# Patient Record
Sex: Female | Born: 1990 | Race: White | Hispanic: No | Marital: Single | Smoking: Former smoker
Health system: Southern US, Community
[De-identification: ages and names within clinical notes are randomized; demographics above are authoritative.]

## PROBLEM LIST (undated history)

## (undated) DIAGNOSIS — R195 Other fecal abnormalities: Secondary | ICD-10-CM

## (undated) DIAGNOSIS — L299 Pruritus, unspecified: Secondary | ICD-10-CM

## (undated) DIAGNOSIS — A072 Cryptosporidiosis: Secondary | ICD-10-CM

## (undated) DIAGNOSIS — M1990 Unspecified osteoarthritis, unspecified site: Secondary | ICD-10-CM

## (undated) DIAGNOSIS — F419 Anxiety disorder, unspecified: Secondary | ICD-10-CM

## (undated) DIAGNOSIS — R519 Headache, unspecified: Secondary | ICD-10-CM

## (undated) DIAGNOSIS — L409 Psoriasis, unspecified: Secondary | ICD-10-CM

## (undated) DIAGNOSIS — G4730 Sleep apnea, unspecified: Secondary | ICD-10-CM

## (undated) DIAGNOSIS — K219 Gastro-esophageal reflux disease without esophagitis: Secondary | ICD-10-CM

## (undated) DIAGNOSIS — F32A Depression, unspecified: Secondary | ICD-10-CM

## (undated) DIAGNOSIS — G43909 Migraine, unspecified, not intractable, without status migrainosus: Secondary | ICD-10-CM

## (undated) HISTORY — DX: Migraine, unspecified, not intractable, without status migrainosus: G43.909

## (undated) HISTORY — DX: Unspecified osteoarthritis, unspecified site: M19.90

## (undated) HISTORY — DX: Sleep apnea, unspecified: G47.30

## (undated) HISTORY — PX: URETERAL EXPLORATION: SHX2609

## (undated) HISTORY — DX: Cryptosporidiosis: A07.2

## (undated) HISTORY — PX: WISDOM TOOTH EXTRACTION: SHX21

## (undated) HISTORY — DX: Gastro-esophageal reflux disease without esophagitis: K21.9

## (undated) HISTORY — DX: Pruritus, unspecified: L29.9

## (undated) HISTORY — DX: Depression, unspecified: F32.A

## (undated) HISTORY — DX: Psoriasis, unspecified: L40.9

## (undated) HISTORY — DX: Other fecal abnormalities: R19.5

## (undated) HISTORY — PX: TONSILLECTOMY: SUR1361

## (undated) HISTORY — DX: Headache, unspecified: R51.9

## (undated) HISTORY — PX: BLADDER SURGERY: SHX569

---
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
## 2012-03-04 DIAGNOSIS — Z3009 Encounter for other general counseling and advice on contraception: Secondary | ICD-10-CM | POA: Insufficient documentation

## 2012-03-04 DIAGNOSIS — Z3042 Encounter for surveillance of injectable contraceptive: Secondary | ICD-10-CM | POA: Insufficient documentation

## 2012-03-04 DIAGNOSIS — Z309 Encounter for contraceptive management, unspecified: Secondary | ICD-10-CM | POA: Insufficient documentation

## 2012-08-20 DIAGNOSIS — B977 Papillomavirus as the cause of diseases classified elsewhere: Secondary | ICD-10-CM | POA: Insufficient documentation

## 2014-10-04 ENCOUNTER — Ambulatory Visit: Payer: Medicaid Other | Attending: Family Medicine | Admitting: Family Medicine

## 2014-10-04 ENCOUNTER — Encounter: Payer: Self-pay | Admitting: Family Medicine

## 2014-10-04 VITALS — BP 99/65 | HR 98 | Temp 98.4°F | Resp 18 | Ht 63.0 in | Wt 149.0 lb

## 2014-10-04 DIAGNOSIS — J069 Acute upper respiratory infection, unspecified: Secondary | ICD-10-CM | POA: Insufficient documentation

## 2014-10-04 DIAGNOSIS — Z23 Encounter for immunization: Secondary | ICD-10-CM

## 2014-10-04 DIAGNOSIS — Z30011 Encounter for initial prescription of contraceptive pills: Secondary | ICD-10-CM | POA: Insufficient documentation

## 2014-10-04 DIAGNOSIS — IMO0001 Reserved for inherently not codable concepts without codable children: Secondary | ICD-10-CM | POA: Insufficient documentation

## 2014-10-04 LAB — POCT URINE PREGNANCY: Preg Test, Ur: NEGATIVE

## 2014-10-04 MED ORDER — NORGESTIMATE-ETH ESTRADIOL 0.25-35 MG-MCG PO TABS: 1.0000 | ORAL_TABLET | Freq: Every day | ORAL | Status: DC

## 2014-10-04 MED ORDER — TRIAMCINOLONE ACETONIDE 55 MCG/ACT NA AERO: 2.0000 | INHALATION_SPRAY | Freq: Every day | NASAL | Status: DC

## 2014-10-04 NOTE — Progress Notes (Signed)
Establish care Complaining Cough, sinus congestion x1 week Stated want to change from Depo-Inj to [facility]

## 2014-10-04 NOTE — Patient Instructions (Signed)
Ms. Bethann Goo,  Thank you for coming in today. It was a pleasure meeting you. I look forward to being your primary doctor.  Start sprintec for birth control. Use with back up birth control, condoms for first week.  Recommend nasal saline or nasacort for cold symptoms.   Dr. Adrian Blackwater

## 2014-10-04 NOTE — Progress Notes (Signed)
   Subjective:    Patient ID: Ann Rice, female    DOB: 01-27-1991, 23 y.o.   MRN: [id number] CC: establish care, cough and congestion, birth control  HPI  1. Congestion: x one week. With cough that is yellow/green. No fever. No CP or SOB. With sinus pressure, frontal and temple headache.  Sick contact is mother. Taking Nyquil and Sudafed for symptoms. Symptoms are the same.   2. Birth control: last depo 05/2014. Past due for injection.  On depo for 3 years. Interested to switching to pills. Sexually active with her husband only. Not interested in IUD, nexplanon, nuva ring or patch at this time.   Soc hx: non smoker Fam hx: negative for DM2 Surg hx: s/p L foot surg  Review of Systems As per HPI     Objective:   Physical Exam BP 99/65  Pulse 98  Temp(Src) 98.4 F (36.9 C) (Oral)  Resp 18  Ht 5\' 3"  (1.6 m)  Wt 149 lb (67.586 kg)  BMI 26.40 kg/m2  SpO2 100% General appearance: alert, cooperative and no distress Eyes: conjunctivae/corneas clear. PERRL, EOM's intact.  Ears: normal TM's and external ear canals both ears Nose: no discharge, turbinates pink, swollen Throat: lips, mucosa, and tongue normal; teeth and gums normal Lungs: clear to auscultation bilaterally Heart: regular rate and rhythm, S1, S2 normal, no murmur, click, rub or gallop     Assessment & Plan:

## 2014-10-04 NOTE — Assessment & Plan Note (Signed)
You have a viral URI with cough. For this please do the following:  1. Drink plenty of fluids. Hot tea, soup etc will help open your nasal passages. 2. Nasal saline or nasacort to open nasal passages

## 2014-10-04 NOTE — Assessment & Plan Note (Addendum)
A: switching from depo to OCP U preg negative  P:  sprintec prescribed

## 2015-05-14 ENCOUNTER — Encounter (HOSPITAL_COMMUNITY): Payer: Self-pay | Admitting: *Deleted

## 2015-05-14 ENCOUNTER — Emergency Department (HOSPITAL_COMMUNITY)
Admission: EM | Admit: 2015-05-14 | Discharge: 2015-05-14 | Disposition: A | Discharge status: Home / Self Care | Payer: Medicaid Other | Attending: Emergency Medicine | Admitting: Emergency Medicine

## 2015-05-14 DIAGNOSIS — K0889 Other specified disorders of teeth and supporting structures: Secondary | ICD-10-CM

## 2015-05-14 DIAGNOSIS — K088 Other specified disorders of teeth and supporting structures: Secondary | ICD-10-CM | POA: Insufficient documentation

## 2015-05-14 MED ORDER — IBUPROFEN 800 MG PO TABS: 800.0000 mg | ORAL_TABLET | Freq: Three times a day (TID) | ORAL | Status: DC

## 2015-05-14 NOTE — ED Provider Notes (Signed)
CSN: [id number]     Arrival date & time 05/14/15  1833 History  This chart was scribed for Quincy Carnes, PA-C working with Orlie Dakin, MD by Randa Evens, ED Scribe. This patient was seen in room WTR6/WTR6 and the patient's care was started at 6:51 PM.     Chief Complaint  Patient presents with  . Dental Pain   The history is provided by the patient. No language interpreter was used.   HPI Comments: Ann Rice is a 24 y.o. female who presents to the Emergency Department complaining of lower left dental pain that's worse on the left onset 4 days. Pt states that the pain radiating towards her front teeth. Pt states that she notices wisdom teeth are coming in. Pt states she has noticed some slight bleeding as well on the right side. Pt states that the pain is worse when eating. Pt doesn't report any medications PTA. Pt doesn't report fever, chills or trouble swallowing.    History reviewed. No pertinent past medical history. Past Surgical History  Procedure Laterality Date  . Tonsillectomy      At age of 36  . Foot surgery Left 2008     great toe broken, pin placed and removed    Family History  Problem Relation Age of Onset  . Anxiety disorder Mother   . Cancer Maternal Grandfather     lung   . Cancer Paternal Grandmother 24    lung cancer   . Diabetes Neg Hx   . Heart disease Neg Hx    History  Substance Use Topics  . Smoking status: Never Smoker   . Smokeless tobacco: Never Used  . Alcohol Use: No   OB History    No data available      Review of Systems  Constitutional: Negative for fever and chills.  HENT: Positive for dental problem. Negative for trouble swallowing.   All other systems reviewed and are negative.    Allergies  Review of patient's allergies indicates no known allergies.  Home Medications   Prior to Admission medications   Medication Sig Start Date End Date Taking? Authorizing Provider  naproxen sodium (ANAPROX) 220 MG tablet Take 440  mg by mouth every 12 (twelve) hours as needed (pain).   Yes Historical Provider, MD  norgestimate-ethinyl estradiol ([place] 28) 0.25-35 MG-MCG tablet Take 1 tablet by mouth daily. Patient not taking: Reported on 05/14/2015 10/04/14   Boykin Nearing, MD  triamcinolone (NASACORT AQ) 55 MCG/ACT AERO nasal inhaler Place 2 sprays into the nose daily. Patient not taking: Reported on 05/14/2015 10/04/14   Josalyn Funches, MD   BP 131/78 mmHg  Pulse 96  Temp(Src) 98.1 F (36.7 C) (Oral)  Resp 17  SpO2 100%  LMP 04/30/2015   Physical Exam  Constitutional: She is oriented to person, place, and time. She appears well-developed and well-nourished. No distress.  HENT:  Head: Normocephalic and atraumatic.  Mouth/Throat: Uvula is midline, oropharynx is clear and moist and mucous membranes are normal. Normal dentition. No dental abscesses or dental caries. No oropharyngeal exudate, posterior oropharyngeal edema, posterior oropharyngeal erythema or tonsillar abscesses.  Teeth largely in good dentition, left lower wisdom tooth palpable beneath gum but has not erupted through, surrounding gingiva normal in appearance, handling secretions appropriately, no trismus  Eyes: Conjunctivae and EOM are normal. Pupils are equal, round, and reactive to light.  Neck: Normal range of motion. Neck supple.  Cardiovascular: Normal rate, regular rhythm and normal heart sounds.   Pulmonary/Chest: Effort normal  and breath sounds normal. No respiratory distress. She has no wheezes.  Musculoskeletal: Normal range of motion.  Neurological: She is alert and oriented to person, place, and time.  Skin: Skin is warm and dry. She is not diaphoretic.  Psychiatric: She has a normal mood and affect.  Nursing note and vitals reviewed.   ED Course  Procedures (including critical care time) DIAGNOSTIC STUDIES: Oxygen Saturation is 100% on RA, normal by my interpretation.    COORDINATION OF CARE: 7:04 PM-Discussed treatment plan  with pt at bedside and pt agreed to plan.    Labs Review Labs Reviewed - No data to display  Imaging Review No results found.   EKG Interpretation None      MDM   Final diagnoses:  Pain, dental   Dental pain secondary to wisdom teeth, has not erupted through the gums yet. No signs of infection at this time.  Will refer to dentist as well as oral surgeon.  Motrin for pain.  Discussed plan with patient, he/she acknowledged understanding and agreed with plan of care.  Return precautions given for new or worsening symptoms.  I personally performed the services described in this documentation, which was scribed in my presence. The recorded information has been reviewed and is accurate.  Larene Pickett, PA-C 05/14/15 [place], MD 05/14/15 [phone]

## 2015-05-14 NOTE — ED Notes (Signed)
Pt reports wisdom teeth pain x 3 days.  Reports noticing blood coming from the right side.

## 2015-05-14 NOTE — Discharge Instructions (Signed)
Take the prescribed medication as directed. Follow-up with Dr. Geralynn Ochs-- call to make appt. Return to the ED for new or worsening symptoms.

## 2016-11-25 ENCOUNTER — Ambulatory Visit: Payer: Self-pay

## 2018-12-31 DIAGNOSIS — Z3490 Encounter for supervision of normal pregnancy, unspecified, unspecified trimester: Secondary | ICD-10-CM | POA: Insufficient documentation

## 2019-01-27 LAB — OB RESULTS CONSOLE GC/CHLAMYDIA
Chlamydia: NEGATIVE
Gonorrhea: NEGATIVE

## 2019-01-27 LAB — OB RESULTS CONSOLE RPR: RPR: NONREACTIVE

## 2019-01-27 LAB — OB RESULTS CONSOLE ABO/RH: RH Type: POSITIVE

## 2019-01-27 LAB — OB RESULTS CONSOLE HIV ANTIBODY (ROUTINE TESTING): HIV: NONREACTIVE

## 2019-01-27 LAB — OB RESULTS CONSOLE RUBELLA ANTIBODY, IGM: Rubella: IMMUNE

## 2019-01-27 LAB — OB RESULTS CONSOLE HEPATITIS B SURFACE ANTIGEN: Hepatitis B Surface Ag: NEGATIVE

## 2019-01-27 LAB — OB RESULTS CONSOLE ANTIBODY SCREEN: Antibody Screen: NEGATIVE

## 2019-07-29 LAB — OB RESULTS CONSOLE GBS: GBS: NEGATIVE

## 2019-08-05 ENCOUNTER — Other Ambulatory Visit: Payer: Self-pay | Admitting: Obstetrics and Gynecology

## 2019-08-06 ENCOUNTER — Encounter (HOSPITAL_COMMUNITY): Payer: Self-pay | Admitting: *Deleted

## 2019-08-06 ENCOUNTER — Telehealth (HOSPITAL_COMMUNITY): Payer: Self-pay | Admitting: *Deleted

## 2019-08-06 NOTE — Telephone Encounter (Signed)
Preadmission screen  

## 2019-08-16 ENCOUNTER — Other Ambulatory Visit (HOSPITAL_COMMUNITY)
Admission: RE | Admit: 2019-08-16 | Discharge: 2019-08-16 | Disposition: A | Discharge status: Home / Self Care | Payer: BC Managed Care – PPO | Source: Ambulatory Visit | Attending: Obstetrics and Gynecology | Admitting: Obstetrics and Gynecology

## 2019-08-16 ENCOUNTER — Other Ambulatory Visit: Payer: Self-pay

## 2019-08-16 DIAGNOSIS — Z01812 Encounter for preprocedural laboratory examination: Secondary | ICD-10-CM | POA: Diagnosis present

## 2019-08-16 DIAGNOSIS — Z20828 Contact with and (suspected) exposure to other viral communicable diseases: Secondary | ICD-10-CM | POA: Insufficient documentation

## 2019-08-16 LAB — SARS CORONAVIRUS 2 (TAT 6-24 HRS): SARS Coronavirus 2: NEGATIVE

## 2019-08-16 NOTE — MAU Note (Signed)
Asymptomatic, swab collected. 

## 2019-08-18 ENCOUNTER — Other Ambulatory Visit: Payer: Self-pay

## 2019-08-18 ENCOUNTER — Inpatient Hospital Stay (HOSPITAL_COMMUNITY)
Admission: AD | Admit: 2019-08-18 | Discharge: 2019-08-22 | DRG: 787 | Disposition: A | Discharge status: Home / Self Care | Payer: BC Managed Care – PPO | Attending: Obstetrics and Gynecology | Admitting: Obstetrics and Gynecology

## 2019-08-18 ENCOUNTER — Inpatient Hospital Stay (HOSPITAL_COMMUNITY): Payer: BC Managed Care – PPO | Admitting: Anesthesiology

## 2019-08-18 ENCOUNTER — Encounter (HOSPITAL_COMMUNITY): Payer: Self-pay | Admitting: *Deleted

## 2019-08-18 ENCOUNTER — Inpatient Hospital Stay (HOSPITAL_COMMUNITY): Payer: BC Managed Care – PPO

## 2019-08-18 DIAGNOSIS — O3663X0 Maternal care for excessive fetal growth, third trimester, not applicable or unspecified: Principal | ICD-10-CM | POA: Diagnosis present

## 2019-08-18 DIAGNOSIS — O26893 Other specified pregnancy related conditions, third trimester: Secondary | ICD-10-CM | POA: Diagnosis present

## 2019-08-18 DIAGNOSIS — F3181 Bipolar II disorder: Secondary | ICD-10-CM | POA: Diagnosis present

## 2019-08-18 DIAGNOSIS — O9902 Anemia complicating childbirth: Secondary | ICD-10-CM | POA: Diagnosis present

## 2019-08-18 DIAGNOSIS — D649 Anemia, unspecified: Secondary | ICD-10-CM | POA: Diagnosis present

## 2019-08-18 DIAGNOSIS — Z3A39 39 weeks gestation of pregnancy: Secondary | ICD-10-CM

## 2019-08-18 DIAGNOSIS — O99344 Other mental disorders complicating childbirth: Secondary | ICD-10-CM | POA: Diagnosis present

## 2019-08-18 DIAGNOSIS — E669 Obesity, unspecified: Secondary | ICD-10-CM | POA: Diagnosis present

## 2019-08-18 DIAGNOSIS — F419 Anxiety disorder, unspecified: Secondary | ICD-10-CM | POA: Diagnosis present

## 2019-08-18 DIAGNOSIS — O99214 Obesity complicating childbirth: Secondary | ICD-10-CM | POA: Diagnosis present

## 2019-08-18 HISTORY — DX: Anxiety disorder, unspecified: F41.9

## 2019-08-18 LAB — CBC
HCT: 32.4 % — ABNORMAL LOW (ref 36.0–46.0)
Hemoglobin: 10.1 g/dL — ABNORMAL LOW (ref 12.0–15.0)
MCH: 24.9 pg — ABNORMAL LOW (ref 26.0–34.0)
MCHC: 31.2 g/dL (ref 30.0–36.0)
MCV: 80 fL (ref 80.0–100.0)
Platelets: 333 10*3/uL (ref 150–400)
RBC: 4.05 MIL/uL (ref 3.87–5.11)
RDW: 15 % (ref 11.5–15.5)
WBC: 12.6 10*3/uL — ABNORMAL HIGH (ref 4.0–10.5)
nRBC: 0 % (ref 0.0–0.2)

## 2019-08-18 LAB — TYPE AND SCREEN
ABO/RH(D): O POS
Antibody Screen: NEGATIVE

## 2019-08-18 LAB — ABO/RH: ABO/RH(D): O POS

## 2019-08-18 LAB — RPR: RPR Ser Ql: NONREACTIVE

## 2019-08-18 MED ORDER — OXYTOCIN BOLUS FROM INFUSION: 500.0000 mL | Freq: Once | INTRAVENOUS | Status: DC

## 2019-08-18 MED ORDER — FENTANYL-BUPIVACAINE-NACL 0.5-0.125-0.9 MG/250ML-% EP SOLN
12.0000 mL/h | EPIDURAL | Status: DC | PRN
  Filled 2019-08-18: qty 250

## 2019-08-18 MED ORDER — LIDOCAINE HCL (PF) 1 % IJ SOLN
INTRAMUSCULAR | Status: DC | PRN
  Administered 2019-08-18: 4 mL via EPIDURAL
  Administered 2019-08-18: 5 mL via EPIDURAL

## 2019-08-18 MED ORDER — MISOPROSTOL 25 MCG QUARTER TABLET
25.0000 ug | ORAL_TABLET | ORAL | Status: DC | PRN
  Administered 2019-08-18 (×3): 25 ug via VAGINAL
  Filled 2019-08-18 (×3): qty 1

## 2019-08-18 MED ORDER — ONDANSETRON HCL 4 MG/2ML IJ SOLN: 4.0000 mg | Freq: Four times a day (QID) | INTRAMUSCULAR | Status: DC | PRN

## 2019-08-18 MED ORDER — TERBUTALINE SULFATE 1 MG/ML IJ SOLN: 0.2500 mg | Freq: Once | INTRAMUSCULAR | Status: DC | PRN

## 2019-08-18 MED ORDER — ACETAMINOPHEN 325 MG PO TABS: 650.0000 mg | ORAL_TABLET | ORAL | Status: DC | PRN

## 2019-08-18 MED ORDER — SOD CITRATE-CITRIC ACID 500-334 MG/5ML PO SOLN
30.0000 mL | ORAL | Status: DC | PRN
  Administered 2019-08-19: 30 mL via ORAL
  Filled 2019-08-18: qty 30

## 2019-08-18 MED ORDER — LACTATED RINGERS IV SOLN
INTRAVENOUS | Status: DC
  Administered 2019-08-18 – 2019-08-19 (×5): via INTRAVENOUS

## 2019-08-18 MED ORDER — EPHEDRINE 5 MG/ML INJ: 10.0000 mg | INTRAVENOUS | Status: DC | PRN

## 2019-08-18 MED ORDER — PHENYLEPHRINE 40 MCG/ML (10ML) SYRINGE FOR IV PUSH (FOR BLOOD PRESSURE SUPPORT): 80.0000 ug | PREFILLED_SYRINGE | INTRAVENOUS | Status: DC | PRN

## 2019-08-18 MED ORDER — LIDOCAINE HCL (PF) 1 % IJ SOLN: 30.0000 mL | INTRAMUSCULAR | Status: DC | PRN

## 2019-08-18 MED ORDER — LACTATED RINGERS IV SOLN: 500.0000 mL | Freq: Once | INTRAVENOUS | Status: DC

## 2019-08-18 MED ORDER — OXYTOCIN 40 UNITS IN NORMAL SALINE INFUSION - SIMPLE MED: 2.5000 [IU]/h | INTRAVENOUS | Status: DC

## 2019-08-18 MED ORDER — FENTANYL-BUPIVACAINE-NACL 0.5-0.125-0.9 MG/250ML-% EP SOLN
EPIDURAL | Status: AC
  Filled 2019-08-18: qty 250

## 2019-08-18 MED ORDER — LACTATED RINGERS IV SOLN: 500.0000 mL | INTRAVENOUS | Status: DC | PRN

## 2019-08-18 MED ORDER — DIPHENHYDRAMINE HCL 50 MG/ML IJ SOLN
12.5000 mg | INTRAMUSCULAR | Status: AC | PRN
  Administered 2019-08-18 – 2019-08-19 (×3): 12.5 mg via INTRAVENOUS
  Filled 2019-08-18 (×2): qty 1

## 2019-08-18 MED ORDER — OXYTOCIN 40 UNITS IN NORMAL SALINE INFUSION - SIMPLE MED
1.0000 m[IU]/min | INTRAVENOUS | Status: DC
  Administered 2019-08-18: 2 m[IU]/min via INTRAVENOUS
  Filled 2019-08-18: qty 1000

## 2019-08-18 MED ORDER — SODIUM CHLORIDE (PF) 0.9 % IJ SOLN
INTRAMUSCULAR | Status: DC | PRN
  Administered 2019-08-18: 12 mL/h via EPIDURAL

## 2019-08-18 NOTE — Anesthesia Procedure Notes (Signed)
Epidural Patient location during procedure: OB Start time: 08/18/2019 5:20 PM End time: 08/18/2019 5:24 PM  Staffing Anesthesiologist: Audry Pili, MD Performed: anesthesiologist   Preanesthetic Checklist Completed: patient identified, pre-op evaluation, timeout performed, IV checked, risks and benefits discussed and monitors and equipment checked  Epidural Patient position: sitting Prep: DuraPrep Patient monitoring: continuous pulse ox and blood pressure Approach: midline Location: L2-L3 Injection technique: LOR saline  Needle:  Needle type: Tuohy  Needle gauge: 17 G Needle length: 9 cm Needle insertion depth: 5.5 cm Catheter size: 19 Gauge Catheter at skin depth: 11 cm Test dose: negative and Other (1% lidocaine)  Assessment Events: blood not aspirated  Additional Notes Patient identified. Risks including, but not limited to, bleeding, infection, nerve damage, paralysis, inadequate analgesia, blood pressure changes, nausea, vomiting, allergic reaction, postpartum back pain, itching, and headache were discussed. Patient expressed understanding and wished to proceed. Sterile prep and drape, including hand hygiene, mask, and sterile gloves were used. The patient was positioned and the spine was prepped. The skin was anesthetized with lidocaine. No paraesthesia or other complication noted. The patient did not experience any signs of intravascular injection such as tinnitus or metallic taste in mouth, nor signs of intrathecal spread such as rapid motor block. Please see nursing notes for vital signs. The patient tolerated the procedure well.   Renold Don, MDReason for block:procedure for pain

## 2019-08-18 NOTE — Anesthesia Preprocedure Evaluation (Addendum)
Anesthesia Evaluation  Patient identified by MRN, date of birth, ID band Patient awake    Reviewed: Allergy & Precautions, NPO status , Patient's Chart, lab work & pertinent test results  History of Anesthesia Complications Negative for: history of anesthetic complications  Airway Mallampati: II   Neck ROM: Full    Dental   Pulmonary Current Smoker and Patient abstained from smoking.,    Pulmonary exam normal        Cardiovascular negative cardio ROS Normal cardiovascular exam     Neuro/Psych PSYCHIATRIC DISORDERS Anxiety negative neurological ROS     GI/Hepatic negative GI ROS, Neg liver ROS,   Endo/Other   Obesity   Renal/GU negative Renal ROS     Musculoskeletal negative musculoskeletal ROS (+)   Abdominal (+) + obese,   Peds  Hematology  (+) anemia ,   Anesthesia Other Findings   Reproductive/Obstetrics (+) Pregnancy                            Anesthesia Physical Anesthesia Plan  ASA: II  Anesthesia Plan: Epidural   Post-op Pain Management:    Induction:   PONV Risk Score and Plan: 2 and Treatment may vary due to age or medical condition  Airway Management Planned: Natural Airway  Additional Equipment: None  Intra-op Plan:   Post-operative Plan:   Informed Consent: I have reviewed the patients History and Physical, chart, labs and discussed the procedure including the risks, benefits and alternatives for the proposed anesthesia with the patient or authorized representative who has indicated his/her understanding and acceptance.       Plan Discussed with: Anesthesiologist  Anesthesia Plan Comments: (Labs reviewed. Platelets acceptable, patient not taking any blood thinning medications. Per RN, FHR tracing reported to be stable enough for sitting procedure. Risks and benefits discussed with patient, including PDPH, backache, epidural hematoma, failed epidural, blood  pressure changes, allergic reaction, and nerve injury. Patient expressed understanding and wished to proceed.)        Anesthesia Quick Evaluation

## 2019-08-18 NOTE — H&P (Signed)
28 y.o. [redacted]w[redacted]d  G1P0 comes in for induction at term.  Otherwise has good fetal movement and no bleeding.  Past Medical History:  Diagnosis Date  . Anxiety   . Psoriasis     Past Surgical History:  Procedure Laterality Date  . BLADDER SURGERY    . FOOT SURGERY Left 2008    great toe broken, pin placed and removed   . TONSILLECTOMY     At age of 36  . WISDOM TOOTH EXTRACTION      OB History  Gravida Para Term Preterm AB Living  1            SAB TAB Ectopic Multiple Live Births               # Outcome Date GA Lbr Len/2nd Weight Sex Delivery Anes PTL Lv  1 Current             Social History   Socioeconomic History  . Marital status: Married    Spouse name: Not on file  . Number of children: 0   . Years of education: some colle  . Highest education level: Not on file  Occupational History  . Occupation: Unemployed     Employer: [employer]  Social Needs  . Financial resource strain: Not hard at all  . Food insecurity    Worry: Never true    Inability: Never true  . Transportation needs    Medical: No    Non-medical: No  Tobacco Use  . Smoking status: Never Smoker  . Smokeless tobacco: Never Used  Substance and Sexual Activity  . Alcohol use: No  . Drug use: No  . Sexual activity: Yes    Birth control/protection: Injection  Lifestyle  . Physical activity    Days per week: Not on file    Minutes per session: Not on file  . Stress: Not on file  Relationships  . Social Herbalist on phone: Not on file    Gets together: Not on file    Attends religious service: Not on file    Active member of club or organization: Not on file    Attends meetings of clubs or organizations: Not on file    Relationship status: Not on file  . Intimate partner violence    Fear of current or ex partner: Not on file    Emotionally abused: Not on file    Physically abused: Not on file    Forced sexual activity: Not on file  Other Topics Concern  . Not on file   Social History Narrative   Lives with husband.   Has 2 dogs.          Patient has no known allergies.    Prenatal Transfer Tool  Maternal Diabetes: No Genetic Screening: Normal Maternal Ultrasounds/Referrals: Other:poly for third trimester now resolved Fetal Ultrasounds or other Referrals:  None Maternal Substance Abuse:  No Significant Maternal Medications:  Meds include: Other: Latuda, celexa, flexeril, hydroxyzine Significant Maternal Lab Results: Group B Strep negative  Other PNC: Complicated by Type 2 bipolar d/o being treated with Latuda and Celexa.  Anxiety during pregnancy worsened.  Pt's fetus has always measured in 90%+ and last US showed EFW to be 4050 gm (9#).  Poly has resolved.    Vitals:   08/18/19 0500 08/18/19 0600 08/18/19 0700 08/18/19 0730  BP: 126/73 122/77 125/81   Pulse: 88 90 91   Resp: 14 16 14    Temp: 98.2  F (36.8 C)   98.2 F (36.8 C)  TempSrc: Oral   Oral  Weight:      Height:        Lungs/Cor:  NAD Abdomen:  soft, gravid Ex:  no cords, erythema SVE:  1/30/-2, VTX FHTs:  120s, good STV, NST R; Cat 1 tracing. Toco:  q 5-20   A/P   Term for induction.  LGA but well under cut off for C/S.  Waiting longer is unlikely to increase chances of vaginal delivery.  GBS neg.  Daria Pastures

## 2019-08-18 NOTE — Progress Notes (Signed)
Patient expressed desire to evaluate need of epidural at 1645. Pain at this time is equal to a 3.

## 2019-08-18 NOTE — Progress Notes (Signed)
Spoke to Dr. Philis Pique at 2322. Dr. Philis Pique stated to increase pitocin up to 20. Once at 20, cut pitocin in half and continue to titrate up as needed.

## 2019-08-19 LAB — COMPREHENSIVE METABOLIC PANEL
ALT: 13 U/L (ref 0–44)
AST: 21 U/L (ref 15–41)
Albumin: 2.5 g/dL — ABNORMAL LOW (ref 3.5–5.0)
Alkaline Phosphatase: 143 U/L — ABNORMAL HIGH (ref 38–126)
Anion gap: 9 (ref 5–15)
BUN: <5 mg/dL — ABNORMAL LOW (ref 6–20)
CO2: 23 mmol/L (ref 22–32)
Calcium: 8.7 mg/dL — ABNORMAL LOW (ref 8.9–10.3)
Chloride: 105 mmol/L (ref 98–111)
Creatinine, Ser: 0.74 mg/dL (ref 0.44–1.00)
GFR calc Af Amer: >60 mL/min (ref >60)
GFR calc non Af Amer: >60 mL/min (ref >60)
Glucose, Bld: 100 mg/dL — ABNORMAL HIGH (ref 70–99)
Potassium: 3.6 mmol/L (ref 3.5–5.1)
Sodium: 137 mmol/L (ref 135–145)
Total Bilirubin: 0.5 mg/dL (ref 0.3–1.2)
Total Protein: 5.8 g/dL — ABNORMAL LOW (ref 6.5–8.1)

## 2019-08-19 LAB — CBC
HCT: 33.2 % — ABNORMAL LOW (ref 36.0–46.0)
Hemoglobin: 10.5 g/dL — ABNORMAL LOW (ref 12.0–15.0)
MCH: 25.1 pg — ABNORMAL LOW (ref 26.0–34.0)
MCHC: 31.6 g/dL (ref 30.0–36.0)
MCV: 79.4 fL — ABNORMAL LOW (ref 80.0–100.0)
Platelets: 308 10*3/uL (ref 150–400)
RBC: 4.18 MIL/uL (ref 3.87–5.11)
RDW: 15.1 % (ref 11.5–15.5)
WBC: 17.3 10*3/uL — ABNORMAL HIGH (ref 4.0–10.5)
nRBC: 0 % (ref 0.0–0.2)

## 2019-08-19 MED ORDER — MORPHINE SULFATE (PF) 0.5 MG/ML IJ SOLN
INTRAMUSCULAR | Status: AC
  Filled 2019-08-19: qty 10

## 2019-08-19 NOTE — Progress Notes (Signed)
Called by Dr. Philis Pique for assistance placing foley bulb for patient.   Patient amenable, SVE 1-2/80/-1, FB placed with 60cc, well tolerated by patient.

## 2019-08-19 NOTE — Progress Notes (Signed)
Pt is getting more uncomfortable and has been pushing with great effort for over 3 hours with some intermittent breaks.  Vitals:   08/19/19 2201 08/19/19 2230 08/19/19 2302 08/19/19 2316  BP: 129/74 112/66 121/65   Pulse: 83 90 (!) 121   Resp: 18 18 18    Temp:    98.3 F (36.8 C)  TempSrc:    Oral  Weight:      Height:       FHTS 120s, gSTV, NST R Toco q 2-3 SVE still C/C/+1 from when checked by myself at 2100  A/P Pt with good pushing effort for over 3 hours with no descent.  Not in range for VE assistance.  D/w pt all R/B/Alt of C/S and pt agrees to proceed.

## 2019-08-19 NOTE — Progress Notes (Signed)
Pt comfortable with epidural.  Foley bulb was placed and then out in an hour as pt made it to 4 cm.  Vitals:   08/19/19 0602 08/19/19 0631 08/19/19 0700 08/19/19 0701  BP: 129/84 136/81 (!) 142/83 (!) 142/83  Pulse: 89 81 86 86  Resp: 18 18  18   Temp:    98 F (36.7 C)  TempSrc:    Oral  Weight:      Height:       FHTs 120s, gSTV, NST R Toco q3-4 SVE now 6-7/C/0  A/P Continue induction.  Last 2 BPs were elevated- first one was earlier this am at about 1 am.  Will send labs again today.  Will treat with magnesium sulfate if severe.

## 2019-08-19 NOTE — Progress Notes (Signed)
Foley bulb placed at 2357 by Dr. Dione Plover per Dr. Philis Pique request.

## 2019-08-20 ENCOUNTER — Encounter (HOSPITAL_COMMUNITY): Payer: Self-pay

## 2019-08-20 ENCOUNTER — Encounter (HOSPITAL_COMMUNITY)
Admission: AD | Disposition: A | Discharge status: Home / Self Care | Payer: Self-pay | Source: Home / Self Care | Attending: Obstetrics and Gynecology

## 2019-08-20 LAB — CBC
HCT: 27.5 % — ABNORMAL LOW (ref 36.0–46.0)
Hemoglobin: 8.7 g/dL — ABNORMAL LOW (ref 12.0–15.0)
MCH: 25.4 pg — ABNORMAL LOW (ref 26.0–34.0)
MCHC: 31.6 g/dL (ref 30.0–36.0)
MCV: 80.4 fL (ref 80.0–100.0)
Platelets: 320 10*3/uL (ref 150–400)
RBC: 3.42 MIL/uL — ABNORMAL LOW (ref 3.87–5.11)
RDW: 15.3 % (ref 11.5–15.5)
WBC: 26.7 10*3/uL — ABNORMAL HIGH (ref 4.0–10.5)
nRBC: 0 % (ref 0.0–0.2)

## 2019-08-20 SURGERY — Surgical Case
Anesthesia: Epidural | Wound class: Clean Contaminated

## 2019-08-20 MED ORDER — OXYTOCIN 40 UNITS IN NORMAL SALINE INFUSION - SIMPLE MED: 2.5000 [IU]/h | INTRAVENOUS | Status: AC

## 2019-08-20 MED ORDER — PHENYLEPHRINE HCL (PRESSORS) 10 MG/ML IV SOLN
INTRAVENOUS | Status: DC | PRN
  Administered 2019-08-20 (×2): 40 ug via INTRAVENOUS
  Administered 2019-08-20: 80 ug via INTRAVENOUS

## 2019-08-20 MED ORDER — SIMETHICONE 80 MG PO CHEW: 80.0000 mg | CHEWABLE_TABLET | ORAL | Status: DC | PRN

## 2019-08-20 MED ORDER — CITALOPRAM HYDROBROMIDE 20 MG PO TABS
20.0000 mg | ORAL_TABLET | Freq: Every day | ORAL | Status: DC
  Administered 2019-08-20 – 2019-08-22 (×3): 20 mg via ORAL
  Filled 2019-08-20 (×3): qty 1

## 2019-08-20 MED ORDER — SIMETHICONE 80 MG PO CHEW
80.0000 mg | CHEWABLE_TABLET | ORAL | Status: DC
  Administered 2019-08-21 – 2019-08-22 (×2): 80 mg via ORAL
  Filled 2019-08-20 (×2): qty 1

## 2019-08-20 MED ORDER — KETOROLAC TROMETHAMINE 30 MG/ML IJ SOLN: 30.0000 mg | Freq: Once | INTRAMUSCULAR | Status: DC

## 2019-08-20 MED ORDER — METHYLERGONOVINE MALEATE 0.2 MG/ML IJ SOLN
INTRAMUSCULAR | Status: AC
  Filled 2019-08-20: qty 1

## 2019-08-20 MED ORDER — FENTANYL CITRATE (PF) 100 MCG/2ML IJ SOLN
INTRAMUSCULAR | Status: AC
  Filled 2019-08-20: qty 2

## 2019-08-20 MED ORDER — TRIAMCINOLONE ACETONIDE 55 MCG/ACT NA AERO: 2.0000 | INHALATION_SPRAY | Freq: Every day | NASAL | Status: DC

## 2019-08-20 MED ORDER — OXYCODONE HCL 5 MG PO TABS: 5.0000 mg | ORAL_TABLET | Freq: Once | ORAL | Status: DC | PRN

## 2019-08-20 MED ORDER — KETOROLAC TROMETHAMINE 30 MG/ML IJ SOLN: 30.0000 mg | Freq: Four times a day (QID) | INTRAMUSCULAR | Status: AC | PRN

## 2019-08-20 MED ORDER — MORPHINE SULFATE (PF) 0.5 MG/ML IJ SOLN
INTRAMUSCULAR | Status: DC | PRN
  Administered 2019-08-20: 3 mg via EPIDURAL

## 2019-08-20 MED ORDER — NALOXONE HCL 0.4 MG/ML IJ SOLN: 0.4000 mg | INTRAMUSCULAR | Status: DC | PRN

## 2019-08-20 MED ORDER — METHYLERGONOVINE MALEATE 0.2 MG/ML IJ SOLN
0.2000 mg | Freq: Once | INTRAMUSCULAR | Status: AC
  Administered 2019-08-20: 0.2 mg via INTRAMUSCULAR

## 2019-08-20 MED ORDER — CYCLOBENZAPRINE HCL 10 MG PO TABS
10.0000 mg | ORAL_TABLET | Freq: Three times a day (TID) | ORAL | Status: DC | PRN
  Filled 2019-08-20: qty 1

## 2019-08-20 MED ORDER — SODIUM CHLORIDE 0.9 % IV SOLN
INTRAVENOUS | Status: DC | PRN
  Administered 2019-08-20: 01:00:00 via INTRAVENOUS

## 2019-08-20 MED ORDER — FERROUS SULFATE 325 (65 FE) MG PO TABS
325.0000 mg | ORAL_TABLET | Freq: Two times a day (BID) | ORAL | Status: DC
  Administered 2019-08-20 – 2019-08-22 (×5): 325 mg via ORAL
  Filled 2019-08-20 (×5): qty 1

## 2019-08-20 MED ORDER — TETANUS-DIPHTH-ACELL PERTUSSIS 5-2.5-18.5 LF-MCG/0.5 IM SUSP: 0.5000 mL | Freq: Once | INTRAMUSCULAR | Status: DC

## 2019-08-20 MED ORDER — SENNOSIDES-DOCUSATE SODIUM 8.6-50 MG PO TABS
2.0000 | ORAL_TABLET | ORAL | Status: DC
  Administered 2019-08-21 – 2019-08-22 (×2): 2 via ORAL
  Filled 2019-08-20 (×2): qty 2

## 2019-08-20 MED ORDER — LACTATED RINGERS IV SOLN
INTRAVENOUS | Status: DC | PRN
  Administered 2019-08-20 (×3): via INTRAVENOUS

## 2019-08-20 MED ORDER — KETOROLAC TROMETHAMINE 30 MG/ML IJ SOLN
30.0000 mg | Freq: Four times a day (QID) | INTRAMUSCULAR | Status: AC | PRN
  Administered 2019-08-20: 30 mg via INTRAMUSCULAR

## 2019-08-20 MED ORDER — DIPHENHYDRAMINE HCL 25 MG PO CAPS: 25.0000 mg | ORAL_CAPSULE | ORAL | Status: DC | PRN

## 2019-08-20 MED ORDER — ONDANSETRON HCL 4 MG/2ML IJ SOLN: 4.0000 mg | Freq: Three times a day (TID) | INTRAMUSCULAR | Status: DC | PRN

## 2019-08-20 MED ORDER — DIPHENHYDRAMINE HCL 25 MG PO CAPS
25.0000 mg | ORAL_CAPSULE | Freq: Four times a day (QID) | ORAL | Status: DC | PRN
  Administered 2019-08-20 (×2): 25 mg via ORAL
  Filled 2019-08-20 (×2): qty 1

## 2019-08-20 MED ORDER — FENTANYL CITRATE (PF) 100 MCG/2ML IJ SOLN: 25.0000 ug | INTRAMUSCULAR | Status: DC | PRN

## 2019-08-20 MED ORDER — METHYLERGONOVINE MALEATE 0.2 MG PO TABS: 0.2000 mg | ORAL_TABLET | ORAL | Status: DC | PRN

## 2019-08-20 MED ORDER — NALBUPHINE HCL 10 MG/ML IJ SOLN: 5.0000 mg | INTRAMUSCULAR | Status: DC | PRN

## 2019-08-20 MED ORDER — DEXAMETHASONE SODIUM PHOSPHATE 10 MG/ML IJ SOLN
INTRAMUSCULAR | Status: AC
  Filled 2019-08-20: qty 1

## 2019-08-20 MED ORDER — METHYLERGONOVINE MALEATE 0.2 MG/ML IJ SOLN: 0.2000 mg | INTRAMUSCULAR | Status: DC | PRN

## 2019-08-20 MED ORDER — PRENATAL MULTIVITAMIN CH
1.0000 | ORAL_TABLET | Freq: Every day | ORAL | Status: DC
  Administered 2019-08-20 – 2019-08-22 (×3): 1 via ORAL
  Filled 2019-08-20 (×3): qty 1

## 2019-08-20 MED ORDER — LORATADINE 10 MG PO TABS
10.0000 mg | ORAL_TABLET | Freq: Every day | ORAL | Status: DC
  Administered 2019-08-20 – 2019-08-22 (×3): 10 mg via ORAL
  Filled 2019-08-20 (×3): qty 1

## 2019-08-20 MED ORDER — BISACODYL 10 MG RE SUPP: 10.0000 mg | Freq: Every day | RECTAL | Status: DC | PRN

## 2019-08-20 MED ORDER — MEASLES, MUMPS & RUBELLA VAC IJ SOLR: 0.5000 mL | Freq: Once | INTRAMUSCULAR | Status: DC

## 2019-08-20 MED ORDER — LACTATED RINGERS IV SOLN
INTRAVENOUS | Status: DC
  Administered 2019-08-20 (×2): via INTRAVENOUS

## 2019-08-20 MED ORDER — NALOXONE HCL 4 MG/10ML IJ SOLN
1.0000 ug/kg/h | INTRAVENOUS | Status: DC | PRN
  Filled 2019-08-20: qty 5

## 2019-08-20 MED ORDER — NALBUPHINE HCL 10 MG/ML IJ SOLN: 5.0000 mg | Freq: Once | INTRAMUSCULAR | Status: DC | PRN

## 2019-08-20 MED ORDER — METOCLOPRAMIDE HCL 5 MG/ML IJ SOLN
INTRAMUSCULAR | Status: DC | PRN
  Administered 2019-08-20: 10 mg via INTRAVENOUS

## 2019-08-20 MED ORDER — COCONUT OIL OIL: 1.0000 "application " | TOPICAL_OIL | Status: DC | PRN

## 2019-08-20 MED ORDER — ZOLPIDEM TARTRATE 5 MG PO TABS: 5.0000 mg | ORAL_TABLET | Freq: Every evening | ORAL | Status: DC | PRN

## 2019-08-20 MED ORDER — DIBUCAINE (PERIANAL) 1 % EX OINT: 1.0000 "application " | TOPICAL_OINTMENT | CUTANEOUS | Status: DC | PRN

## 2019-08-20 MED ORDER — OXYCODONE HCL 5 MG/5ML PO SOLN: 5.0000 mg | Freq: Once | ORAL | Status: DC | PRN

## 2019-08-20 MED ORDER — PHENYLEPHRINE 40 MCG/ML (10ML) SYRINGE FOR IV PUSH (FOR BLOOD PRESSURE SUPPORT)
PREFILLED_SYRINGE | INTRAVENOUS | Status: AC
  Filled 2019-08-20: qty 10

## 2019-08-20 MED ORDER — IBUPROFEN 800 MG PO TABS
800.0000 mg | ORAL_TABLET | Freq: Three times a day (TID) | ORAL | Status: DC
  Administered 2019-08-20 – 2019-08-21 (×5): 800 mg via ORAL
  Filled 2019-08-20 (×6): qty 1

## 2019-08-20 MED ORDER — MEPERIDINE HCL 25 MG/ML IJ SOLN: 6.2500 mg | INTRAMUSCULAR | Status: DC | PRN

## 2019-08-20 MED ORDER — METOCLOPRAMIDE HCL 5 MG/ML IJ SOLN
INTRAMUSCULAR | Status: AC
  Filled 2019-08-20: qty 2

## 2019-08-20 MED ORDER — CEFAZOLIN SODIUM-DEXTROSE 2-3 GM-%(50ML) IV SOLR
INTRAVENOUS | Status: DC | PRN
  Administered 2019-08-20: 2 g via INTRAVENOUS

## 2019-08-20 MED ORDER — LURASIDONE HCL 20 MG PO TABS
20.0000 mg | ORAL_TABLET | Freq: Every day | ORAL | Status: DC
  Administered 2019-08-20 – 2019-08-22 (×3): 20 mg via ORAL
  Filled 2019-08-20 (×3): qty 1

## 2019-08-20 MED ORDER — WITCH HAZEL-GLYCERIN EX PADS: 1.0000 "application " | MEDICATED_PAD | CUTANEOUS | Status: DC | PRN

## 2019-08-20 MED ORDER — FLEET ENEMA 7-19 GM/118ML RE ENEM: 1.0000 | ENEMA | Freq: Every day | RECTAL | Status: DC | PRN

## 2019-08-20 MED ORDER — ONDANSETRON HCL 4 MG/2ML IJ SOLN: 4.0000 mg | Freq: Four times a day (QID) | INTRAMUSCULAR | Status: DC | PRN

## 2019-08-20 MED ORDER — SIMETHICONE 80 MG PO CHEW
80.0000 mg | CHEWABLE_TABLET | Freq: Three times a day (TID) | ORAL | Status: DC
  Administered 2019-08-20 – 2019-08-22 (×7): 80 mg via ORAL
  Filled 2019-08-20 (×7): qty 1

## 2019-08-20 MED ORDER — HYDROXYZINE HCL 10 MG PO TABS
10.0000 mg | ORAL_TABLET | Freq: Three times a day (TID) | ORAL | Status: DC | PRN
  Filled 2019-08-20: qty 1

## 2019-08-20 MED ORDER — OXYCODONE-ACETAMINOPHEN 5-325 MG PO TABS
1.0000 | ORAL_TABLET | ORAL | Status: DC | PRN
  Administered 2019-08-21 (×2): 1 via ORAL
  Administered 2019-08-21 – 2019-08-22 (×3): 2 via ORAL
  Filled 2019-08-20 (×2): qty 2
  Filled 2019-08-20 (×2): qty 1
  Filled 2019-08-20: qty 2

## 2019-08-20 MED ORDER — SODIUM CHLORIDE 0.9 % IV SOLN
INTRAVENOUS | Status: DC | PRN
  Administered 2019-08-20: 40 [IU] via INTRAVENOUS

## 2019-08-20 MED ORDER — ONDANSETRON HCL 4 MG/2ML IJ SOLN
INTRAMUSCULAR | Status: DC | PRN
  Administered 2019-08-20: 4 mg via INTRAVENOUS

## 2019-08-20 MED ORDER — DIPHENHYDRAMINE HCL 50 MG/ML IJ SOLN: 12.5000 mg | INTRAMUSCULAR | Status: DC | PRN

## 2019-08-20 MED ORDER — MENTHOL 3 MG MT LOZG: 1.0000 | LOZENGE | OROMUCOSAL | Status: DC | PRN

## 2019-08-20 MED ORDER — FENTANYL CITRATE (PF) 100 MCG/2ML IJ SOLN
INTRAMUSCULAR | Status: DC | PRN
  Administered 2019-08-20: 100 ug via EPIDURAL

## 2019-08-20 MED ORDER — KETOROLAC TROMETHAMINE 30 MG/ML IJ SOLN
INTRAMUSCULAR | Status: AC
  Filled 2019-08-20: qty 1

## 2019-08-20 MED ORDER — ONDANSETRON HCL 4 MG/2ML IJ SOLN
INTRAMUSCULAR | Status: AC
  Filled 2019-08-20: qty 2

## 2019-08-20 MED ORDER — DEXAMETHASONE SODIUM PHOSPHATE 10 MG/ML IJ SOLN
INTRAMUSCULAR | Status: DC | PRN
  Administered 2019-08-20: 10 mg via INTRAVENOUS

## 2019-08-20 MED ORDER — SODIUM CHLORIDE 0.9% FLUSH: 3.0000 mL | INTRAVENOUS | Status: DC | PRN

## 2019-08-20 MED ORDER — SODIUM BICARBONATE 8.4 % IV SOLN
INTRAVENOUS | Status: DC | PRN
  Administered 2019-08-20: 10 mL via EPIDURAL
  Administered 2019-08-20: 2 mL via EPIDURAL

## 2019-08-20 MED ORDER — CEFAZOLIN SODIUM-DEXTROSE 2-4 GM/100ML-% IV SOLN
INTRAVENOUS | Status: AC
  Filled 2019-08-20: qty 100

## 2019-08-20 SURGICAL SUPPLY — 33 items
APL SKNCLS STERI-STRIP NONHPOA (GAUZE/BANDAGES/DRESSINGS) ×1
BENZOIN TINCTURE PRP APPL 2/3 (GAUZE/BANDAGES/DRESSINGS) ×2 IMPLANT
CHLORAPREP W/TINT 26ML (MISCELLANEOUS) ×2 IMPLANT
CLAMP CORD UMBIL (MISCELLANEOUS) IMPLANT
CLOTH BEACON ORANGE TIMEOUT ST (SAFETY) ×2 IMPLANT
DRSG OPSITE POSTOP 4X10 (GAUZE/BANDAGES/DRESSINGS) ×2 IMPLANT
ELECT REM PT RETURN 9FT ADLT (ELECTROSURGICAL) ×2
ELECTRODE REM PT RTRN 9FT ADLT (ELECTROSURGICAL) ×1 IMPLANT
EXTRACTOR VACUUM BELL STYLE (SUCTIONS) IMPLANT
GLOVE BIO SURGEON STRL SZ7 (GLOVE) ×2 IMPLANT
GLOVE BIOGEL PI IND STRL 7.0 (GLOVE) ×1 IMPLANT
GLOVE BIOGEL PI INDICATOR 7.0 (GLOVE) ×2
GOWN STRL REUS W/TWL LRG LVL3 (GOWN DISPOSABLE) ×4 IMPLANT
KIT ABG SYR 3ML LUER SLIP (SYRINGE) IMPLANT
NDL HYPO 25X5/8 SAFETYGLIDE (NEEDLE) IMPLANT
NEEDLE HYPO 25X5/8 SAFETYGLIDE (NEEDLE) IMPLANT
NS IRRIG 1000ML POUR BTL (IV SOLUTION) ×2 IMPLANT
PACK C SECTION WH (CUSTOM PROCEDURE TRAY) ×2 IMPLANT
PAD OB MATERNITY 4.3X12.25 (PERSONAL CARE ITEMS) ×2 IMPLANT
PENCIL SMOKE EVAC W/HOLSTER (ELECTROSURGICAL) ×2 IMPLANT
RTRCTR C-SECT PINK 25CM LRG (MISCELLANEOUS) ×2 IMPLANT
STRIP CLOSURE SKIN 1/2X4 (GAUZE/BANDAGES/DRESSINGS) ×2 IMPLANT
SUT MNCRL 0 VIOLET CTX 36 (SUTURE) ×2 IMPLANT
SUT MONOCRYL 0 CTX 36 (SUTURE) ×2
SUT PLAIN 2 0 XLH (SUTURE) ×1 IMPLANT
SUT VIC AB 0 CT1 27 (SUTURE) ×4
SUT VIC AB 0 CT1 27XBRD ANBCTR (SUTURE) ×2 IMPLANT
SUT VIC AB 2-0 CT1 27 (SUTURE) ×2
SUT VIC AB 2-0 CT1 TAPERPNT 27 (SUTURE) ×1 IMPLANT
SUT VIC AB 4-0 KS 27 (SUTURE) ×2 IMPLANT
TOWEL OR 17X24 6PK STRL BLUE (TOWEL DISPOSABLE) ×2 IMPLANT
TRAY FOLEY W/BAG SLVR 14FR LF (SET/KITS/TRAYS/PACK) ×1 IMPLANT
WATER STERILE IRR 1000ML POUR (IV SOLUTION) ×2 IMPLANT

## 2019-08-20 NOTE — Op Note (Signed)
08/20/2019  1:13 AM  PATIENT:  Ann Rice  28 y.o. female  PRE-OPERATIVE DIAGNOSIS:  Arrest of descent.  POST-OPERATIVE DIAGNOSIS: same  PROCEDURE:  Procedure(s): CESAREAN SECTION (N/A)  SURGEON:  Surgeon(s) and Role:    * Bobbye Charleston, MD - Primary  ANESTHESIA:   epidural  EBL:  677 cc  SPECIMEN:  No Specimen  DISPOSITION OF SPECIMEN:  N/A  COUNTS:  YES  TOURNIQUET:  * No tourniquets in log *  DICTATION: .Note written in EPIC  PLAN OF CARE: Admit to inpatient   PATIENT DISPOSITION:  PACU - hemodynamically stable.   Delay start of Pharmacological VTE agent (>24hrs) due to surgical blood loss or risk of bleeding: not applicable  Complications:  none Medications:  Ancef, Pitocin Findings:  Baby female, Apgars 8,9, weight P.   Normal tubes, ovaries and uterus seen.  Baby was skin to skin with mother after birth in the OR.  Technique:  After adequate epidural anesthesia was achieved, the patient was prepped and draped in usual sterile fashion.  A foley catheter was used to drain the bladder.  A pfannanstiel incision was made with the scalpel and carried down to the fascia with the bovie cautery. The fascia was incised in the midline with the scalpel and carried in a transverse curvilinear manner bilaterally.  The fascia was reflected superiorly and inferiorly off the rectus muscles and the muscles split in the midline.  A bowel free portion of the peritoneum was entered bluntly and then extended in a superior and inferior manner with good visualization of the bowel and bladder.  The Alexis instrument was then placed and the vesico-uterine fascia tented up and incised in a transverse curvilinear manner.  A 2 cm transverse incision was made in the upper portion of the lower uterine segment until the amnion was exposed.   The incision was extended transversely in a blunt manner.  Clear fluid was noted and the baby delivered in the vertex presentation without complication.   The baby was bulb suctioned and the cord was clamped and cut aftet stripping blood from cord into baby.  The baby was then handed to awaiting Neonatology.  The placenta was then delivered manually and the uterus cleared of all debris.  The uterine incision was then closed with a running lock stitch of 0 monocryl.  An imbricating layer of 0 monocryl was closed as well. Excellent hemostasis of the uterine incision was achieved and the abdomen was cleared with irrigation.  The peritoneum was closed with a running stitch of 2-0 vicryl.  This incorporated the rectus muscles as a separate layer.  The fascia was then closed with a running stitch of 0 vicryl.  The subcutaneous layer was closed with interrupted  stitches of 2-0 plain gut.  The skin was closed with 4-0 vicryl on a Keith needle and steri-strips.  The patient tolerated the procedure well and was returned to the recovery room in stable condition.  All counts were correct times three.  Daria Pastures

## 2019-08-20 NOTE — Transfer of Care (Signed)
Immediate Anesthesia Transfer of Care Note  Patient: Ann Rice  Procedure(s) Performed: CESAREAN SECTION (N/A )  Patient Location: PACU  Anesthesia Type:Epidural  Level of Consciousness: awake, alert  and oriented  Airway & Oxygen Therapy: Patient Spontanous Breathing  Post-op Assessment: Report given to RN and Post -op Vital signs reviewed and stable  Post vital signs: Reviewed/Stable  Last Vitals:  Vitals Value Taken Time  BP 111/54 08/20/19 0130  Temp 37.2 C 08/20/19 0130  Pulse 104 08/20/19 0133  Resp 16 08/20/19 0133  SpO2 98 % 08/20/19 0133  Vitals shown include unvalidated device data.  Last Pain:  Vitals:   08/20/19 0130  TempSrc: Oral  PainSc:          Complications: No apparent anesthesia complications

## 2019-08-20 NOTE — Clinical Social Work Maternal (Signed)
CLINICAL SOCIAL WORK MATERNAL/CHILD NOTE  Patient Details  Name: Ann Rice MRN: [id number] Date of Birth: [birth year]  Date:  08/20/2019  Clinical Social Worker Initiating Note:  Abundio Miu, [place] Date/Time: Initiated:  08/20/19/1501     Child's Name:  Ann Rice   Biological Parents:  Mother, Father(Father: Angelena Sand)   Need for Interpreter:  None   Reason for Referral:  Behavioral Health Concerns   Address:  [address] [place] [address]    Phone number:  [phone] (home)     Additional phone number:   Household Members/Support Persons (HM/SP):   Household Member/Support Person 1, Household Member/Support Person 2, Household Member/Support Person 3   HM/SP Name Relationship DOB or Age  HM/SP -1   mother    HM/SP -2   step father    HM/SP -[place] FOB    HM/SP -4        HM/SP -5        HM/SP -6        HM/SP -7        HM/SP -8          Natural Supports (not living in the home):      Professional Supports: Therapist   Employment: Full-time   Type of Work: [employer]   Education:  [place] arranged:    Museum/gallery curator Resources:  Multimedia programmer   Other Resources:      Cultural/Religious Considerations Which May Impact Care:    Strengths:  Ability to meet basic needs , Psychotropic Medications, Engineer, materials, Home prepared for child    Psychotropic Medications:  Celexa, Social worker      Pediatrician:    [facility] area  Pediatrician List:   [place]      Pediatrician Fax Number:    Risk Factors/Current Problems:  Mental Health Concerns    Cognitive State:  Able to Concentrate , Alert , Goal Oriented , Insightful , Linear Thinking    Mood/Affect:  Interested , Calm , Relaxed    CSW Assessment: CSW met with [name] at bedside to discuss consult for behavioral health  concerns, [name]'s mother present. CSW asked [name]'s mother to leave during assessment to speak with [name] privately, [name]'s mother left voluntarily. CSW introduced self and explained reason for consult. [name] was welcoming, polite and engaged during assessment. [name] reported that she resides with her mother, step father and FOB. 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 [name] reported that she has all items needed to care for infant including a car seat and crib. CSW inquired about [name]'s support system, [name] reported that her mother, step father and FOB were her supports.  CSW inquired about [name]'s mental health history. [name] reported that she was diagnosed with Bipolar II Disorder in January or February 2020. [name] reported that prior to starting her medication her symptoms were impulsive behaviors like buying stuff, mood swings, high irritability and being easily frustrated. [name] denied any current symptoms and reported that she is taking [place] which is helpful. [name] reported that she has also been diagnosed with anxiety and depression in 2014. [name] denied any current symptoms and reported that she is currently taking Celexa and Hydroxyzine PRN. [name] reported that she experienced symptoms of depression and anxiety throughout her pregnancy. [name] reported that she is seeing psychiatrist  Laban Emperor and therapist Aniceto Boss from [place]. [name] reported that her OB GYN requested to see her 10 days post delivery to check in and see how she is doing mentally. [name] reported that her next appointment with her psychiatrist is October 6th. CSW inquired about how [name] was feeling emotionally after giving birth, [name] reported that she was "content". CSW assessed for safety, [name] denied SI, HI and domestic violence. [name] presented calm and did not demonstrate any acute mental health signs/symptoms. [name] possessed great insight about her mental health. CSW informed [name] that due to her mental health history she may be more susceptible to  postpartum depression.   CSW provided education regarding the baby blues period vs. perinatal mood disorders, discussed treatment and gave resources for mental health follow up if concerns arise.  CSW recommends self-evaluation during the postpartum time period using the New Mom Checklist from Postpartum Progress and encouraged [name] to contact a medical professional if symptoms are noted at any time.    CSW provided review of Sudden Infant Death Syndrome (SIDS) precautions. [name] verbalized understanding and reported that infant has a crib and two basinets.   CSW identifies no further need for intervention and no barriers to discharge at this time.  CSW Plan/Description:  Sudden Infant Death Syndrome (SIDS) Education, No Further Intervention Required/No Barriers to Discharge, Perinatal Mood and Anxiety Disorder (PMADs) Education    Burnis Medin, LCSW 08/20/2019, 3:04 PM

## 2019-08-20 NOTE — Lactation Note (Signed)
This note was copied from a baby's chart. Lactation Consultation Note  Patient Name: Ann Rice [id number] Date: 08/20/2019 Reason for consult: Initial assessment   P1, Baby 66 hours old and has been spitty. Mother's medications.  Celexa L2, Claritin L1 and Latuda L3. Provided information sheet on Latuda to mother and suggest discussing with Ped MD.   Reviewed hand expression and gave baby "Olen Cordial" drops on spoon. Attempted latching in football hold but baby is sleepy.   Provided mother with hand pump to prepump and help evert short shaft nipples and provided mother with shells. Encouraged STS and continue spoon feeding drops until baby latches and spitty period has resolved.  Feed on demand with cues.  Goal 8-12+ times per day after first 24 hrs.  Place baby STS if not cueing.  Provided lactation brochure with resources.     Maternal Data Has patient been taught Hand Expression?: Yes Does the patient have breastfeeding experience prior to this delivery?: No  Feeding Feeding Type: Breast Fed  LATCH Score                   Interventions Interventions: Breast feeding basics reviewed;Hand express;Hand pump  Lactation Tools Discussed/Used     Consult Status Consult Status: Follow-up Date: 08/21/19 Follow-up type: In-patient    Vivianne Master [facility] 08/20/2019, 10:19 AM

## 2019-08-20 NOTE — Brief Op Note (Signed)
08/20/2019  1:13 AM  PATIENT:  Ann Rice  28 y.o. female  PRE-OPERATIVE DIAGNOSIS:  Arrest of descent.  POST-OPERATIVE DIAGNOSIS: same  PROCEDURE:  Procedure(s): CESAREAN SECTION (N/A)  SURGEON:  Surgeon(s) and Role:    * Bobbye Charleston, MD - Primary  ANESTHESIA:   epidural  EBL:  677 cc  SPECIMEN:  No Specimen  DISPOSITION OF SPECIMEN:  N/A  COUNTS:  YES  TOURNIQUET:  * No tourniquets in log *  DICTATION: .Note written in EPIC  PLAN OF CARE: Admit to inpatient   PATIENT DISPOSITION:  PACU - hemodynamically stable.   Delay start of Pharmacological VTE agent (>24hrs) due to surgical blood loss or risk of bleeding: not applicable

## 2019-08-20 NOTE — Progress Notes (Signed)
Vitals:   08/20/19 0230 08/20/19 0258 08/20/19 0351 08/20/19 0420  BP:  108/76 110/68   Pulse: 93 89 74   Resp:  18 18   Temp: 99.1 F (37.3 C) 98.9 F (37.2 C) 100.1 F (37.8 C) 99.2 F (37.3 C)  TempSrc: Oral Oral Oral Oral  SpO2: 99% 100% 100% 100%  Weight:      Height:       Results for orders placed or performed during the hospital encounter of 08/18/19 (from the past 24 hour(s))  CBC     Status: Abnormal   Collection Time: 08/19/19  7:59 AM  Result Value Ref Range   WBC 17.3 (H) 4.0 - 10.5 K/uL   RBC 4.18 3.87 - 5.11 MIL/uL   Hemoglobin 10.5 (L) 12.0 - 15.0 g/dL   HCT 33.2 (L) 36.0 - 46.0 %   MCV 79.4 (L) 80.0 - 100.0 fL   MCH 25.1 (L) 26.0 - 34.0 pg   MCHC 31.6 30.0 - 36.0 g/dL   RDW 15.1 11.5 - 15.5 %   Platelets 308 150 - 400 K/uL   nRBC 0.0 0.0 - 0.2 %  Comprehensive metabolic panel     Status: Abnormal   Collection Time: 08/19/19  7:59 AM  Result Value Ref Range   Sodium 137 135 - 145 mmol/L   Potassium 3.6 3.5 - 5.1 mmol/L   Chloride 105 98 - 111 mmol/L   CO2 23 22 - 32 mmol/L   Glucose, Bld 100 (H) 70 - 99 mg/dL   BUN <5 (L) 6 - 20 mg/dL   Creatinine, Ser 0.74 0.44 - 1.00 mg/dL   Calcium 8.7 (L) 8.9 - 10.3 mg/dL   Total Protein 5.8 (L) 6.5 - 8.1 g/dL   Albumin 2.5 (L) 3.5 - 5.0 g/dL   AST 21 15 - 41 U/L   ALT 13 0 - 44 U/L   Alkaline Phosphatase 143 (H) 38 - 126 U/L   Total Bilirubin 0.5 0.3 - 1.2 mg/dL   GFR calc non Af Amer >60 >60 mL/min   GFR calc Af Amer >60 >60 mL/min   Anion gap 9 5 - 15  CBC     Status: Abnormal   Collection Time: 08/20/19  6:15 AM  Result Value Ref Range   WBC 26.7 (H) 4.0 - 10.5 K/uL   RBC 3.42 (L) 3.87 - 5.11 MIL/uL   Hemoglobin 8.7 (L) 12.0 - 15.0 g/dL   HCT 27.5 (L) 36.0 - 46.0 %   MCV 80.4 80.0 - 100.0 fL   MCH 25.4 (L) 26.0 - 34.0 pg   MCHC 31.6 30.0 - 36.0 g/dL   RDW 15.3 11.5 - 15.5 %   Platelets 320 150 - 400 K/uL   nRBC 0.0 0.0 - 0.2 %

## 2019-08-20 NOTE — Lactation Note (Addendum)
This note was copied from a baby's chart. Lactation Consultation Note  Patient Name: Ann Rice [id number] Date: 08/20/2019 Reason for consult: Follow-up assessment;Mother's request;Difficult latch;1st time breastfeeding P1, 20 hour female infant. Infant had 6 voids and  5 stools and Nurse changed a stool while LC was in the room. Mom has short shafted nipples that responses well to stimulation, she has been given hand pump, breast shells, DEBP and NS 20 mm by Nurse. Mom requested [place] services due to infant not latching or sustaining latch mom has been using 20 mm NS that was given earlier by Nurse. Mom made two attempts to latch infant to breast she has been giving infant EBM by curve tip syringe. Per mom, she used DEBP twice.  [place] asked mom do a little breast stimulation and hand express prior to latching infant to breast, mom latched infant on left breast using football hold, infant latched without NS and sustained latch, swallows observed by LC and infant breastfeed for 10 minutes.  Infant was given 5 ml of colostrum by spoon. Mom was feeling confident and better now that infant is latching to breast, mom will try to latch infant without NS. Mom will continue to use DEBP every 3 hours for 15 minutes on initial setting. Mom will wear breast shells in bra during the day to help evert nipple shaft out more. Mom knows to call Nurse or [place] if she has any questions, concerns or need assistance with latching infant to breast.  Maternal Data    Feeding Feeding Type: Breast Fed  LATCH Score Latch: Grasps breast easily, tongue down, lips flanged, rhythmical sucking.  Audible Swallowing: Spontaneous and intermittent  Type of Nipple: Everted at rest and after stimulation(short shafted)  Comfort (Breast/Nipple): Soft / non-tender  Hold (Positioning): Assistance needed to correctly position infant at breast and maintain latch.  LATCH Score: 9  Interventions Interventions: Skin to  skin;Hand express;Expressed milk;Position options;Support pillows;Adjust position;Breast compression;Shells;DEBP  Lactation Tools Discussed/Used Tools: Nipple [place] [employer]   Consult Status Consult Status: Follow-up Date: 08/21/19 Follow-up type: In-patient    Vicente Serene 08/20/2019, 9:07 PM

## 2019-08-21 MED ORDER — SCOPOLAMINE 1 MG/3DAYS TD PT72: 1.0000 | MEDICATED_PATCH | Freq: Once | TRANSDERMAL | Status: DC

## 2019-08-21 NOTE — Lactation Note (Signed)
This note was copied from a baby's chart. Lactation Consultation Note  Patient Name: Ann Rice [id number] Date: 08/21/2019 Reason for consult: Follow-up assessment;Difficult latch;Term;Infant weight loss P1, 44 hour female infant, weight loss -5%. LC unable to assist with latch at this time mom  had finished  giving  infant 15 ml of Similac with iron by curve tip syringe as LC entered room. Infant was no longer cuing to feed. Per mom, she has not been wearing breast shells as previously advised by LC. Mom mostly using formula and  mom latched infant to breast  only once today for 3 minutes without nipple shield. Mom pumped 1x with  DEBP but felt discouraged due  to not seeing colostrum  while pumping. LC reviewed hand expression and suggested mom to hand express, mom easily expressed 4 ml of colostrum which she will offer to infant after latching him to breast. LC encourage mom to continue pumping and explained colostrum very thick and in small amounts. Mom interested in using cylinder hand pump because she did see colostrum with it, LC showed grandmother how to assist mom with pumping by converting hand pump into DEBP hand pump.  [place] mom was helping  assist mom  with pumping and colostrum was flow in pump when [place] left room. Mom's plan: 1. Nurse will help assist mom  with latching infant to breast  using 20 mm NS at next feeding.  2. Mom will attempt to latch infant to breast for each feeding before offering formula. 3. Mom will offer the 5 ml of colostrum and any additional amount of EBM that she gets from pumping first to infant before supplementing with formula based on infant 's age/ hours of life. 4. Mom will start pumping every 3 hours after latching infant to breast for 15 minutes using medela DEBP or her choice the  Cylinder hand pump that is converted into a DEBP. 5. Mom will wear breast shells in bra during the day.     Per mom, she really want to breastfed infant.    Maternal Data    Feeding Feeding Type: Formula  LATCH Score Latch: Repeated attempts needed to sustain latch, nipple held in mouth throughout feeding, stimulation needed to elicit sucking reflex.  Audible Swallowing: A few with stimulation  Type of Nipple: Everted at rest and after stimulation  Comfort (Breast/Nipple): Soft / non-tender  Hold (Positioning): Assistance needed to correctly position infant at breast and maintain latch.  LATCH Score: 7  Interventions Interventions: Breast massage;Hand express;Expressed milk;Shells;DEBP;Hand pump  Lactation Tools Discussed/Used     Consult Status Consult Status: Follow-up Date: 08/22/19 Follow-up type: In-patient    Vicente Serene 08/21/2019, 9:24 PM

## 2019-08-21 NOTE — Progress Notes (Signed)
  Patient is eating, ambulating, voiding.  Pain control is good.  Vitals:   08/20/19 1542 08/20/19 1953 08/21/19 0034 08/21/19 0625  BP: 121/72 118/65 96/66 108/79  Pulse:  90 77 91  Resp:  18 17 18   Temp: 98.9 F (37.2 C) 99.7 F (37.6 C) 98.6 F (37 C) 98.7 F (37.1 C)  TempSrc: Oral Oral Oral Oral  SpO2: 98% 96% 96%   Weight:      Height:        lungs:   clear to auscultation cor:    RRR Abdomen:  soft, appropriate tenderness, incisions intact and without erythema or exudate ex:    no cords   Lab Results  Component Value Date   WBC 26.7 (H) 08/20/2019   HGB 8.7 (L) 08/20/2019   HCT 27.5 (L) 08/20/2019   MCV 80.4 08/20/2019   PLT 320 08/20/2019    --/--/O POS, O POS Performed at [facility] [address], [place], [place] [address]  (09/02 0019)/[place]  A/P    Post operative day 1.  Routine post op and postpartum care.  Expect d/c routine.  Percocet for pain control.  Parents desires circumsision.  All risks, benefits and alternatives discussed with the mother.

## 2019-08-21 NOTE — Progress Notes (Signed)
Subjective: Postpartum Day 1: Cesarean Delivery Patient reports pain controlled, no nausea or vomiting, ambulating without difficulty  Objective: Vital signs in last 24 hours: Temp:  [97.5 F (36.4 C)-99.7 F (37.6 C)] 97.5 F (36.4 C) (09/05 1453) Pulse Rate:  [77-92] 92 (09/05 1453) Resp:  [17-18] 18 (09/05 1453) BP: (96-118)/(65-79) 109/75 (09/05 1453) SpO2:  [96 %-100 %] 100 % (09/05 1453)  Physical Exam:  General: alert, cooperative and appears stated age Lochia: appropriate Uterine Fundus: firm Incision: healing well DVT Evaluation: No evidence of DVT seen on physical exam.  Recent Labs    08/19/19 0759 08/20/19 0615  HGB 10.5* 8.7*  HCT 33.2* 27.5*    Assessment/Plan: Status post Cesarean section. Doing well postoperatively.  Continue current care.  Vanessa Kick 08/21/2019, 5:03 PM

## 2019-08-22 MED ORDER — MEDROXYPROGESTERONE ACETATE 150 MG/ML IM SUSP
150.0000 mg | Freq: Once | INTRAMUSCULAR | Status: AC
  Administered 2019-08-22: 150 mg via INTRAMUSCULAR
  Filled 2019-08-22: qty 1

## 2019-08-22 MED ORDER — MEDROXYPROGESTERONE ACETATE 150 MG/ML IM SUSP: 150.0000 mg | INTRAMUSCULAR | 3 refills | Status: DC

## 2019-08-22 MED ORDER — OXYCODONE-ACETAMINOPHEN 5-325 MG PO TABS: 1.0000 | ORAL_TABLET | ORAL | 0 refills | Status: DC | PRN

## 2019-08-22 MED ORDER — FERROUS SULFATE 325 (65 FE) MG PO TABS: 325.0000 mg | ORAL_TABLET | Freq: Two times a day (BID) | ORAL | 3 refills | Status: DC

## 2019-08-22 NOTE — Progress Notes (Signed)
  Patient is eating, ambulating, voiding.  Pain control is good.  Vitals:   08/21/19 0034 08/21/19 0625 08/21/19 1453 08/21/19 2208  BP: 96/66 108/79 109/75 125/79  Pulse: 77 91 92 92  Resp: 17 18 18 17   Temp: 98.6 F (37 C) 98.7 F (37.1 C) (!) 97.5 F (36.4 C) 99 F (37.2 C)  TempSrc: Oral Oral Oral   SpO2: 96%  100%   Weight:      Height:        lungs:   clear to auscultation cor:    RRR Abdomen:  soft, appropriate tenderness, incisions intact and without erythema or exudate ex:    no cords   Lab Results  Component Value Date   WBC 26.7 (H) 08/20/2019   HGB 8.7 (L) 08/20/2019   HCT 27.5 (L) 08/20/2019   MCV 80.4 08/20/2019   PLT 320 08/20/2019    --/--/O POS, O POS Performed at [facility] [address], [place], [place] [address]  (09/02 0019)/[place]  A/P    Post operative day 2.  Routine post op and postpartum care.  Expect d/c routine.  Percocet for pain control. Iron.

## 2019-08-22 NOTE — Discharge Summary (Signed)
Obstetric Discharge Summary Reason for Admission: induction of labor Prenatal Procedures: NST Intrapartum Procedures: cesarean: low cervical, transverse Postpartum Procedures: none Complications-Operative and Postpartum: none Hemoglobin  Date Value Ref Range Status  08/20/2019 8.7 (L) 12.0 - 15.0 g/dL Final   HCT  Date Value Ref Range Status  08/20/2019 27.5 (L) 36.0 - 46.0 % Final    Discharge Diagnoses: Term Pregnancy-delivered  Discharge Information: Date: 08/22/2019 Activity: pelvic rest Diet: routine Medications: Ibuprofen, Iron and Percocet Condition: stable Instructions: refer to practice specific booklet Discharge to: home Follow-up Information    Bobbye Charleston, MD Follow up in 4 week(s).   Specialty: Obstetrics and Gynecology Contact information: [place] [address] [phone]           Newborn Data: Live born female  Birth Weight: 8 lb 9.2 oz (3890 g) APGAR: 61, 9  Newborn Delivery   Birth date/time: 08/20/2019 00:36:00 Delivery type: C-Section, Low Transverse Trial of labor: Yes C-section categorization: Primary      Home with mother.  Ann Rice 08/22/2019, 6:49 AM

## 2019-08-24 NOTE — Anesthesia Postprocedure Evaluation (Signed)
Anesthesia Post Note  Patient: Ann Rice  Procedure(s) Performed: CESAREAN SECTION (N/A )     Patient location during evaluation: PACU Anesthesia Type: Epidural Level of consciousness: oriented and awake and alert Pain management: pain level controlled Vital Signs Assessment: post-procedure vital signs reviewed and stable Respiratory status: spontaneous breathing, respiratory function stable and patient connected to nasal cannula oxygen Cardiovascular status: blood pressure returned to baseline and stable Postop Assessment: no headache, no backache and no apparent nausea or vomiting Anesthetic complications: no    Last Vitals:  Vitals:   08/21/19 2208 08/22/19 0653  BP: 125/79 118/65  Pulse: 92 86  Resp: 17 16  Temp: 37.2 C 37.2 C  SpO2:  99%    Last Pain:  Vitals:   08/22/19 1130  TempSrc:   PainSc: [place]

## 2019-12-30 ENCOUNTER — Encounter (HOSPITAL_COMMUNITY): Payer: Self-pay | Admitting: Obstetrics and Gynecology

## 2020-12-21 ENCOUNTER — Encounter: Payer: Self-pay | Admitting: Neurology

## 2020-12-29 NOTE — Progress Notes (Signed)
Patient had to reschedule appointment.  She was unable to log onto the virtual visit.

## 2021-01-01 ENCOUNTER — Encounter: Payer: Self-pay | Admitting: Neurology

## 2021-01-01 ENCOUNTER — Telehealth (INDEPENDENT_AMBULATORY_CARE_PROVIDER_SITE_OTHER): Payer: Medicaid Other | Admitting: Neurology

## 2021-01-01 ENCOUNTER — Other Ambulatory Visit: Payer: Self-pay

## 2021-01-01 VITALS — Ht 63.0 in | Wt 204.0 lb

## 2021-01-01 DIAGNOSIS — R519 Headache, unspecified: Secondary | ICD-10-CM

## 2021-02-08 DIAGNOSIS — L299 Pruritus, unspecified: Secondary | ICD-10-CM | POA: Insufficient documentation

## 2021-02-26 NOTE — Progress Notes (Signed)
NEUROLOGY CONSULTATION NOTE  Ann Rice MRN: [id number] DOB: 11-25-1991  Referring provider: Bobbye Charleston, MD Primary care provider: No PCP  Reason for consult:  headaches  Assessment/Plan:   Migraine without aura, without status migrainosus, not intractable  Even though she is not suffering from frequent headaches at the moment, we will start a daily preventative as she is concerned that they may return when she starts her new job. 1.  Migraine prevention:  Start topiramate 25mg  at bedtime for one week, then increase to 50mg  at bedtime.  Discussed side effects (paresthesias, cognitive changes, kidney stones, glaucoma, as well as teratogenic properties and to take precautions not to get pregnant).  Will have labs (CBC and CMP) from her other physician's office faxed to me. 2.  Migraine rescue:  She will try sumatriptan 100mg  with Zofran.  Stop rizatriptan as it is ineffective. 3.  Limit use of pain relievers to no more than 2 days out of week to prevent risk of rebound or medication-overuse headache. 4.  Keep headache diary 5.  Follow up 6 months.   Subjective:  Ann Rice is a 30 year old right-handed female with psoriasis who presents for headaches.  History supplemented by referring provider's note.  She had had headaches as a child which eventually resolved.  She started having headaches again in late [facility] December 2021.  She describes a severe pulsating pain in the left parietal region with nausea, photophobia, phonophobia, blurred vision and sometimes vomiting.  No numbness or weakness.  They would last 10-12 hours and occur 3 to 4 times a week.  One time, she had an intractable headache lasting 2 1/2 to 3 days, requiring a Toradol shot.  She was unable to drive home from work.  Triggers include stress, caffeine withdrawal and possibly vaping.  Sleep helped relieve it.  She thinks it work-related stress was the primary trigger.  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  She left her job in late January and headaches pretty much resolved.  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    Current NSAIDS/analgesics:  none Current triptans:  Rizatriptan 10mg  Current ergotamine:  none Current anti-emetic:  Zofran ODT 8mg  Current muscle relaxants:  none Current Antihypertensive medications:  Propranolol 10mg  TID PRN anxiety (has not started it yet) Current Antidepressant medications:  Citalopram 30mg  QD Current Anticonvulsant medications:  none Current anti-CGRP:  none Current Vitamins/Herbal/Supplements:  Ferrous sulfate Current Antihistamines/Decongestants:  Loratidine, hydroxyzine Other therapy:  none Hormone/birth control:  medroxyprogesterone Other medications:  Latuda  Past NSAIDS/analgesics:  none Past abortive triptans:  none Past abortive ergotamine:  none Past muscle relaxants:  none Past anti-emetic:  none Past antihypertensive medications:  none Past antidepressant medications:  none Past anticonvulsant medications:  none Past anti-CGRP:  none Past vitamins/Herbal/Supplements:  none Past antihistamines/decongestants:  none Other past therapies:  none  Caffeine:  excess Diet:  Poor  PAST MEDICAL HISTORY: Past Medical History:  Diagnosis Date  . Anxiety   . Psoriasis     PAST SURGICAL HISTORY: Past Surgical History:  Procedure Laterality Date  . BLADDER SURGERY    . CESAREAN SECTION N/A 08/20/2019   Procedure: CESAREAN SECTION;  Surgeon: Bobbye Charleston, MD;  Location: [facility];  Service: Obstetrics;  Laterality: N/A;  . FOOT SURGERY Left 2008    great toe broken, pin placed and removed   .  TONSILLECTOMY     At age of 77  . WISDOM TOOTH EXTRACTION      MEDICATIONS: Current Outpatient Medications on File Prior to Visit  Medication Sig Dispense Refill  . citalopram (CELEXA) 20 MG  tablet Take 20 mg by mouth daily.    . cyclobenzaprine (FLEXERIL) 10 MG tablet Take 10 mg by mouth every 8 (eight) hours as needed for moderate pain.    . ferrous sulfate 325 (65 FE) MG tablet Take 1 tablet (325 mg total) by mouth 2 (two) times daily with a meal. (Patient not taking: Reported on 01/01/2021) 60 tablet 3  . folic acid (FOLVITE) 810 MCG tablet Take 800 mcg by mouth daily. (Patient not taking: Reported on 01/01/2021)    . hydrOXYzine (ATARAX/VISTARIL) 10 MG tablet Take 10 mg by mouth 3 (three) times daily as needed for anxiety.    Marland Kitchen ibuprofen (ADVIL,MOTRIN) 800 MG tablet Take 1 tablet (800 mg total) by mouth 3 (three) times daily. (Patient not taking: No sig reported) 21 tablet 0  . LATUDA 20 MG TABS tablet Take 20 mg by mouth daily.    Marland Kitchen loratadine (CLARITIN) 10 MG tablet Take 10 mg by mouth daily.    . Loratadine 10 MG CAPS Take 10 mg by mouth daily.    . medroxyPROGESTERone (DEPO-PROVERA) 150 MG/ML injection Inject 1 mL (150 mg total) into the muscle every 3 (three) months. 1 mL 3  . ondansetron (ZOFRAN-ODT) 8 MG disintegrating tablet Take 8 mg by mouth 2 (two) times daily.    Marland Kitchen oxyCODONE-acetaminophen (PERCOCET/ROXICET) 5-325 MG tablet Take 1-2 tablets by mouth every 4 (four) hours as needed for moderate pain. (Patient not taking: Reported on 01/01/2021) 30 tablet 0  . Prenatal Vit-Fe Phos-FA-Omega (VITAFOL GUMMIES) 3.33-0.333-34.8 MG CHEW Chew 3 tablets by mouth daily. (Patient not taking: Reported on 01/01/2021)    . promethazine (PHENERGAN) 25 MG tablet Take 25 mg by mouth every 4 (four) hours.    . rizatriptan (MAXALT) 10 MG tablet Take 10 mg by mouth as needed.    . triamcinolone (NASACORT AQ) 55 MCG/ACT AERO nasal inhaler Place 2 sprays into the nose daily. (Patient not taking: No sig reported) 1 Inhaler 12   No current facility-administered medications on file prior to visit.    ALLERGIES: No Known Allergies  FAMILY HISTORY: Family History  Problem Relation Age of Onset   . Anxiety disorder Mother   . Cancer Maternal Grandfather        lung   . Cancer Paternal Grandmother 71       lung cancer   . Diabetes Neg Hx   . Heart disease Neg Hx     Objective:  Blood pressure 109/79, pulse 78, resp. rate 18, height 5\' 3"  (1.6 m), weight 201 lb (91.2 kg), SpO2 95 %, unknown if currently breastfeeding. General: No acute distress.  Patient appears well-groomed.   Head:  Normocephalic/atraumatic Eyes:  fundi examined but not visualized Neck: supple, no paraspinal tenderness, full range of motion Back: No paraspinal tenderness Heart: regular rate and rhythm Lungs: Clear to auscultation bilaterally. Vascular: No carotid bruits. Neurological Exam: Mental status: alert and oriented to person, place, and time, recent and remote memory intact, fund of knowledge intact, attention and concentration intact, speech fluent and not dysarthric, language intact. Cranial nerves: CN I: not tested CN II: pupils equal, round and reactive to light, visual fields intact CN III, IV, VI:  full range of motion, no nystagmus, no ptosis CN V: facial sensation intact.  CN VII: upper and lower face symmetric CN VIII: hearing intact CN IX, X: gag intact, uvula midline CN XI: sternocleidomastoid and trapezius muscles intact CN XII: tongue midline Bulk & Tone: normal, no fasciculations. Motor:  muscle strength 5/5 throughout Sensation:  Pinprick, temperature and vibratory sensation intact. Deep Tendon Reflexes:  2+ throughout,  toes downgoing.   Finger to nose testing:  Without dysmetria.   Heel to shin:  Without dysmetria.   Gait:  Normal station and stride.  Romberg negative.    Thank you for allowing me to take part in the care of this patient.  Metta Clines, DO

## 2021-02-27 ENCOUNTER — Ambulatory Visit: Payer: Medicaid Other | Admitting: Neurology

## 2021-02-27 ENCOUNTER — Encounter: Payer: Self-pay | Admitting: Neurology

## 2021-02-27 ENCOUNTER — Other Ambulatory Visit: Payer: Self-pay

## 2021-02-27 VITALS — BP 109/79 | HR 78 | Resp 18 | Ht 63.0 in | Wt 201.0 lb

## 2021-02-27 DIAGNOSIS — G43009 Migraine without aura, not intractable, without status migrainosus: Secondary | ICD-10-CM

## 2021-02-27 MED ORDER — TOPIRAMATE 50 MG PO TABS: 50.0000 mg | ORAL_TABLET | Freq: Every day | ORAL | 5 refills | Status: DC

## 2021-02-27 MED ORDER — SUMATRIPTAN SUCCINATE 100 MG PO TABS: ORAL_TABLET | ORAL | 5 refills | Status: DC

## 2021-02-27 NOTE — Patient Instructions (Signed)
  1. Start topiramate 50mg  tablet:  Take 1/2 tablet at bedtime for one week, then increase to 1 tablet at bedtime.  IF no improvement in headaches in 6 weeks, contact me. 2. Take sumatriptan 100mg  at earliest onset of headache.  May repeat dose once in 2 hours if needed.  Maximum 2 tablets in 24 hours.  STOP RIZATRIPTAN 3. Take ondansetron/Zofran for nausea.  4. Limit use of pain relievers to no more than 2 days out of the week.  These medications include acetaminophen, NSAIDs (ibuprofen/Advil/Motrin, naproxen/Aleve, triptans (Imitrex/sumatriptan), Excedrin, and narcotics.  This will help reduce risk of rebound headaches. 5. Be aware of common food triggers:  - Caffeine:  coffee, black tea, cola, Mt. Dew  - Chocolate  - Dairy:  aged cheeses (brie, blue, cheddar, gouda, [place], provolone, [place], Swiss, etc), chocolate milk, buttermilk, sour cream, limit eggs and yogurt  - Nuts, peanut butter  - Alcohol  - Cereals/grains:  FRESH breads (fresh bagels, sourdough, doughnuts), yeast productions  - Processed/canned/aged/cured meats (pre-packaged deli meats, hotdogs)  - MSG/glutamate:  soy sauce, flavor enhancer, pickled/preserved/marinated foods  - Sweeteners:  aspartame (Equal, Nutrasweet).  Sugar and Splenda are okay  - Vegetables:  legumes (lima beans, lentils, snow peas, fava beans, pinto peans, peas, garbanzo beans), sauerkraut, onions, olives, pickles  - Fruit:  avocados, bananas, citrus fruit (orange, lemon, grapefruit), mango  - Other:  Frozen meals, macaroni and cheese 6. Routine exercise 7. Stay adequately hydrated (aim for 64 oz water daily) 8. Keep headache diary 9. Maintain proper stress management 10. Maintain proper sleep hygiene 11. Do not skip meals 12. Consider supplements:  magnesium citrate 400mg  daily, riboflavin 400mg  daily, coenzyme Q10 100mg  three times daily.

## 2021-09-13 ENCOUNTER — Ambulatory Visit: Payer: Medicaid Other | Admitting: Neurology

## 2022-01-22 LAB — HEPATIC FUNCTION PANEL
ALT: 24 U/L (ref 7–35)
AST: 21 (ref 13–35)
Alkaline Phosphatase: 83 (ref 25–125)
Bilirubin, Total: 0.3

## 2022-01-22 LAB — HM PAP SMEAR: HM Pap smear: NEGATIVE

## 2022-01-22 LAB — VITAMIN D 25 HYDROXY (VIT D DEFICIENCY, FRACTURES): Vit D, 25-Hydroxy: 27.7

## 2022-01-22 LAB — BASIC METABOLIC PANEL
BUN: 5 (ref 4–21)
CO2: 23 — AB (ref 13–22)
Chloride: 104 (ref 99–108)
Creatinine: 0.9 (ref 0.5–1.1)
Glucose: 114
Potassium: 4.4 mEq/L (ref 3.5–5.1)
Sodium: 143 (ref 137–147)

## 2022-01-22 LAB — CBC AND DIFFERENTIAL
HCT: 43 (ref 36–46)
Hemoglobin: 14.7 (ref 12.0–16.0)
Platelets: 357 10*3/uL (ref 150–400)
WBC: 7.6

## 2022-01-22 LAB — HEMOGLOBIN A1C: Hemoglobin A1C: 5.5

## 2022-01-22 LAB — LIPID PANEL
Cholesterol: 117 (ref 0–200)
HDL: 35 (ref 35–70)
LDL Cholesterol: 64
LDl/HDL Ratio: 18
Triglycerides: 96 (ref 40–160)

## 2022-01-22 LAB — TSH: TSH: 0.99 (ref 0.41–5.90)

## 2022-01-22 LAB — CBC: RBC: 4.91 (ref 3.87–5.11)

## 2022-01-22 LAB — RESULTS CONSOLE HPV: CHL HPV: NEGATIVE

## 2022-02-19 DIAGNOSIS — D229 Melanocytic nevi, unspecified: Secondary | ICD-10-CM | POA: Insufficient documentation

## 2022-02-20 ENCOUNTER — Telehealth: Payer: Self-pay | Admitting: Dermatology

## 2022-02-20 NOTE — Telephone Encounter (Signed)
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 Philis Pique @ [facility] sent over referral yesterday. Someone here told her she'd need it since she now has MCD. Did we get it, and if so, will you call her back and schedule her? ?

## 2022-03-05 ENCOUNTER — Encounter: Payer: Self-pay | Admitting: Dermatology

## 2022-03-05 ENCOUNTER — Other Ambulatory Visit: Payer: Self-pay | Admitting: Dermatology

## 2022-03-05 ENCOUNTER — Other Ambulatory Visit: Payer: Self-pay

## 2022-03-05 ENCOUNTER — Ambulatory Visit: Payer: Medicaid Other | Admitting: Dermatology

## 2022-03-05 DIAGNOSIS — Z1283 Encounter for screening for malignant neoplasm of skin: Secondary | ICD-10-CM

## 2022-03-05 DIAGNOSIS — D239 Other benign neoplasm of skin, unspecified: Secondary | ICD-10-CM

## 2022-03-05 DIAGNOSIS — L918 Other hypertrophic disorders of the skin: Secondary | ICD-10-CM

## 2022-03-05 DIAGNOSIS — D225 Melanocytic nevi of trunk: Secondary | ICD-10-CM | POA: Diagnosis not present

## 2022-03-05 DIAGNOSIS — D1801 Hemangioma of skin and subcutaneous tissue: Secondary | ICD-10-CM | POA: Diagnosis not present

## 2022-03-05 DIAGNOSIS — L929 Granulomatous disorder of the skin and subcutaneous tissue, unspecified: Secondary | ICD-10-CM | POA: Diagnosis not present

## 2022-03-05 DIAGNOSIS — D2322 Other benign neoplasm of skin of left ear and external auricular canal: Secondary | ICD-10-CM

## 2022-03-05 DIAGNOSIS — D229 Melanocytic nevi, unspecified: Secondary | ICD-10-CM

## 2022-03-05 DIAGNOSIS — D485 Neoplasm of uncertain behavior of skin: Secondary | ICD-10-CM

## 2022-03-05 NOTE — Patient Instructions (Signed)

## 2022-03-11 NOTE — Telephone Encounter (Signed)
-----   Message from Lavonna Monarch, MD sent at 03/08/2022  4:52 AM EDT ----- ?Although this microscopic is not diagnostic, it is certainly compatible with her history of a possible splinter type foreign body.  If it heals well after the biopsy, no further evaluation or treatment will be needed. ?

## 2022-03-11 NOTE — Telephone Encounter (Signed)
Path to patient and she is aware of Dr Delma Officer message.The lesion is healing fine ?

## 2022-03-17 ENCOUNTER — Encounter: Payer: Self-pay | Admitting: Dermatology

## 2022-03-17 NOTE — Progress Notes (Signed)
? ?  New Patient ?  ?Subjective  ?Ann Rice is a 31 y.o. female who presents for the following: Annual Exam (Right knee x years- but changing in last 2 months- ? Foreign objects years ago). ? ?Skin check with concern lesion on right knee where she may have had a splinter. ?Location:  ?Duration:  ?Quality:  ?Associated Signs/Symptoms: ?Modifying Factors:  ?Severity:  ?Timing: ?Context:  ? ? ?The following portions of the chart were reviewed this encounter and updated as appropriate:  Tobacco  Allergies  Meds  Problems  Med Hx  Surg Hx  Fam Hx   ?  ? ?Objective  ?Well appearing patient in no apparent distress; mood and affect are within normal limits. ?Full body exam: No atypical pigmented lesions or nonmelanoma skin cancer. ? ?Chest - Medial [facility]) ?2 mm smooth red dermal papules ? ?Chest - Medial [facility]) ?2 mm pedunculated flesh-colored papule ? ?Mid Back ?Multiple clinically benign nevi, several polypoid.  All with normal dermoscopy. ? ?Right Knee - Anterior ?Granulomatous  ?9 Millimeter pink papule with central vertical indentation.  Patient believes there might have been a prior plan that originally pierced the skin along time ago. ? ? ? ? ? ? ?Left Posterior Auricle ?arciform textured tan papules ? ? ? ?A full examination was performed including scalp, head, eyes, ears, nose, lips, neck, chest, axillae, abdomen, back, buttocks, bilateral upper extremities, bilateral lower extremities, hands, feet, fingers, toes, fingernails, and toenails. All findings within normal limits unless otherwise noted below.  Areas beneath undergarments not examined. ? ? ?Assessment & Plan  ?Encounter for screening for malignant neoplasm of skin ? ?Encouraged to self examine twice annually.  Continue ultraviolet protection. ? ?Cherry angioma ?Chest - Medial [facility]) ? ?Skin tag ?Chest - Medial [facility]) ? ?No intervention necessary ? ?Nevus ?Mid Back ? ?Leave if stable ? ?Neoplasm of uncertain behavior of skin ?Right  Knee - Anterior ? ?Skin / nail biopsy ?Type of biopsy: punch   ?Informed consent: discussed and consent obtained   ?Procedure prep:  Patient was prepped and draped in usual sterile fashion (nonsterile) ?Prep type:  Chlorhexidine ?Anesthesia: the lesion was anesthetized in a standard fashion   ?Anesthetic:  1% lidocaine w/ epinephrine 1-100,000 local infiltration ?Punch size:  3 mm ?Suture size:  4-0 ?Suture type: nylon   ?Suture removal (days):  14 ?Outcome: patient tolerated procedure well   ?Post-procedure details: wound care instructions given   ?Post-procedure details comment:  Nonsterile ?Additional details:  4 nylon x 1 ? ?Specimen 1 - Surgical pathology ?Differential Diagnosis: scc vs bcc ? ?Check Margins: No ? ?Punch biopsy obtained, simple closure ? ?Epidermal nevus ?Left Posterior Auricle ? ?Historically stable, no intervention currently indicated ? ? ?

## 2022-03-23 NOTE — Progress Notes (Signed)
?Subjective:  ? ? Ann Rice - 31 y.o. female MRN [id number]  Date of birth: 16-Jun-1991 ? ?HPI ? ?Ann Rice is to establish care.  ? ?Current issues and/or concerns: ?Reports frequent urination. Annual physical labs with PAP completed recently with her gynecologist February 2023. Labs essentially normal at that time. Declines hepatitis C screening.  ? ? ?ROS per HPI  ? ? ?Health Maintenance:  ?Health Maintenance Due  ?Topic Date Due  ? Hepatitis C Screening  Never done  ? ? ?Past Medical History: ?Patient Active Problem List  ? Diagnosis Date Noted  ? Psoriasis 03/26/2022  ? Urinary tract infectious disease 03/26/2022  ? Multiple benign melanocytic nevi 02/19/2022  ? Ear itch 02/08/2021  ? Macrosomia 08/18/2019  ? Pregnant 12/31/2018  ? Contraception 10/04/2014  ? URI, acute 10/04/2014  ? 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 Birth control counseling 03/04/2012  ? Contraception management 03/04/2012  ? Depo-Provera contraceptive status 03/04/2012  ? Anxiety 12/16/2010  ? Depressive disorder 12/16/2010  ? ? ?Social History  ? reports that she has quit smoking. Her smoking use included cigars. She has been exposed to tobacco smoke. She uses smokeless tobacco. She reports current alcohol use. She reports that she does not use drugs.  ? ?Family History  ?family history includes Anxiety disorder in her mother; Cancer in her maternal grandfather; Cancer (age of onset: 59) in her paternal grandmother.  ? ?Medications: reviewed and updated ?  ?Objective:  ? Physical Exam ?BP 123/85 (BP Location: Left Arm, Patient Position: Sitting, Cuff Size: Large)   Pulse 88   Temp 98.3 ?F (36.8 ?C)   Resp 18   Ht 5' 5.75" (1.67 m)   Wt 203 lb (92.1 kg)   SpO2 96%   Breastfeeding No   BMI 33.02 kg/m?  ? ?Physical Exam ?HENT:  ?   Head: Normocephalic and atraumatic.  ?   Right Ear: Tympanic membrane, ear canal and external ear normal.  ?   Left Ear: Tympanic membrane, ear canal and external ear normal.  ?   Nose: Nose  normal.  ?   Mouth/Throat:  ?   Mouth: Mucous membranes are moist.  ?   Pharynx: Oropharynx is clear. No oropharyngeal exudate.  ?Eyes:  ?   Extraocular Movements: Extraocular movements intact.  ?   Conjunctiva/sclera: Conjunctivae normal.  ?   Pupils: Pupils are equal, round, and reactive to light.  ?Cardiovascular:  ?   Rate and Rhythm: Normal rate and regular rhythm.  ?   Pulses: Normal pulses.  ?   Heart sounds: Normal heart sounds.  ?Pulmonary:  ?   Effort: Pulmonary effort is normal.  ?   Breath sounds: Normal breath sounds.  ?Abdominal:  ?   General: Bowel sounds are normal.  ?   Palpations: Abdomen is soft.  ?Musculoskeletal:     ?   General: Normal range of motion.  ?   Cervical back: Normal range of motion and neck supple.  ?Skin: ?   General: Skin is warm and dry.  ?   Capillary Refill: Capillary refill takes less than 2 seconds.  ?Neurological:  ?   General: No focal deficit present.  ?   Mental Status: She is alert and oriented to person, place, and time.  ?Psychiatric:     ?   Mood and Affect: Mood normal.     ?   Behavior: Behavior normal.  ? ? ?Results for orders placed or performed in visit on 03/27/22  ?CBC and  differential  ?Result Value Ref Range  ? Hemoglobin 14.7 12.0 - 16.0  ? HCT 43 36 - 46  ? Platelets 357 150 - 400 K/uL  ? WBC 7.6   ?CBC  ?Result Value Ref Range  ? RBC 4.91 3.87 - 5.11  ?VITAMIN D 25 Hydroxy (Vit-D Deficiency, Fractures)  ?Result Value Ref Range  ? Vit D, 25-Hydroxy 27.7   ?Basic metabolic panel  ?Result Value Ref Range  ? Glucose 114   ? BUN 5 4 - 21  ? CO2 23 (A) 13 - 22  ? Creatinine 0.9 0.5 - 1.1  ? Potassium 4.4 3.5 - 5.1 mEq/L  ? Sodium 143 137 - 147  ? Chloride 104 99 - 108  ?Lipid panel  ?Result Value Ref Range  ? LDl/HDL Ratio 18.0   ? Triglycerides 96 40 - 160  ? Cholesterol 117 0 - 200  ? HDL 35 35 - 70  ? LDL Cholesterol 64   ?Hepatic function panel  ?Result Value Ref Range  ? Alkaline Phosphatase 83 25 - 125  ? ALT 24 7 - 35 U/L  ? AST 21 13 - 35  ? Bilirubin,  Total 0.3   ?Hemoglobin A1c  ?Result Value Ref Range  ? Hemoglobin A1C 5.5   ?TSH  ?Result Value Ref Range  ? TSH 0.99 0.41 - 5.90  ?Results Console HPV  ?Result Value Ref Range  ? CHL HPV Negative   ?Results for orders placed or performed in visit on 03/27/22  ?POCT URINALYSIS DIP (CLINITEK)  ?Result Value Ref Range  ? Color, UA yellow yellow  ? Clarity, UA clear clear  ? Glucose, UA negative negative mg/dL  ? Bilirubin, UA negative negative  ? Ketones, POC UA negative negative mg/dL  ? Spec Grav, UA 1.025 1.010 - 1.025  ? Blood, UA trace-intact (A) negative  ? pH, UA 7.0 5.0 - 8.0  ? POC PROTEIN,UA negative negative, trace  ? Urobilinogen, UA 0.2 0.2 or 1.0 E.U./dL  ? Nitrite, UA Negative Negative  ? Leukocytes, UA Negative Negative  ? ? ?Assessment & Plan:  ?1. Encounter to establish care: ?- Patient presents today to establish care.  ?- Patient recently had annual physical labs with PAP smear at Gynecology February 2023. Medical assistant has updated labs in patient's chart.  ? ?2. Frequent urination: ?- No urinary tract infection.  ?- Cervicovaginal self-swab to screen for chlamydia, gonorrhea, trichomonas, bacterial vaginitis, and candida vaginitis. ?- POCT URINALYSIS DIP (CLINITEK) ?- Cervicovaginal ancillary only ? ? ? ?Patient was given clear instructions to go to Emergency Department or return to medical center if symptoms don't improve, worsen, or new problems develop.The patient verbalized understanding. ? ?I discussed the assessment and treatment plan with the patient. The patient was provided an opportunity to ask questions and all were answered. The patient agreed with the plan and demonstrated an understanding of the instructions. ?  ?The patient was advised to call back or seek an in-person evaluation if the symptoms worsen or if the condition fails to improve as anticipated. ? ? ? ?Durene Fruits, NP ?03/27/2022, 1:49 PM ?Primary Care at [facility]  ? ?

## 2022-03-26 ENCOUNTER — Encounter: Payer: Self-pay | Admitting: Podiatry

## 2022-03-26 ENCOUNTER — Ambulatory Visit: Payer: Medicaid Other | Admitting: Podiatry

## 2022-03-26 DIAGNOSIS — B351 Tinea unguium: Secondary | ICD-10-CM | POA: Diagnosis not present

## 2022-03-26 DIAGNOSIS — N390 Urinary tract infection, site not specified: Secondary | ICD-10-CM | POA: Insufficient documentation

## 2022-03-26 DIAGNOSIS — L409 Psoriasis, unspecified: Secondary | ICD-10-CM | POA: Insufficient documentation

## 2022-03-26 MED ORDER — TERBINAFINE HCL 250 MG PO TABS: 250.0000 mg | ORAL_TABLET | Freq: Every day | ORAL | 0 refills | Status: DC

## 2022-03-27 ENCOUNTER — Other Ambulatory Visit: Payer: Self-pay | Admitting: Family

## 2022-03-27 ENCOUNTER — Encounter: Payer: Self-pay | Admitting: Family

## 2022-03-27 ENCOUNTER — Ambulatory Visit: Payer: Medicaid Other | Admitting: Family

## 2022-03-27 ENCOUNTER — Other Ambulatory Visit (HOSPITAL_COMMUNITY)
Admission: RE | Admit: 2022-03-27 | Discharge: 2022-03-27 | Disposition: A | Discharge status: Home / Self Care | Payer: Medicaid Other | Source: Ambulatory Visit | Attending: Family | Admitting: Family

## 2022-03-27 VITALS — BP 123/85 | HR 88 | Temp 98.3°F | Resp 18 | Ht 65.75 in | Wt 203.0 lb

## 2022-03-27 DIAGNOSIS — Z13228 Encounter for screening for other metabolic disorders: Secondary | ICD-10-CM

## 2022-03-27 DIAGNOSIS — Z124 Encounter for screening for malignant neoplasm of cervix: Secondary | ICD-10-CM

## 2022-03-27 DIAGNOSIS — Z113 Encounter for screening for infections with a predominantly sexual mode of transmission: Secondary | ICD-10-CM | POA: Insufficient documentation

## 2022-03-27 DIAGNOSIS — Z131 Encounter for screening for diabetes mellitus: Secondary | ICD-10-CM

## 2022-03-27 DIAGNOSIS — Z0000 Encounter for general adult medical examination without abnormal findings: Secondary | ICD-10-CM

## 2022-03-27 DIAGNOSIS — Z1159 Encounter for screening for other viral diseases: Secondary | ICD-10-CM

## 2022-03-27 DIAGNOSIS — R350 Frequency of micturition: Secondary | ICD-10-CM

## 2022-03-27 DIAGNOSIS — Z7689 Persons encountering health services in other specified circumstances: Secondary | ICD-10-CM | POA: Diagnosis not present

## 2022-03-27 DIAGNOSIS — Z1329 Encounter for screening for other suspected endocrine disorder: Secondary | ICD-10-CM

## 2022-03-27 DIAGNOSIS — Z130 Encounter for screening for diseases of the blood and blood-forming organs and certain disorders involving the immune mechanism: Secondary | ICD-10-CM

## 2022-03-27 DIAGNOSIS — Z13220 Encounter for screening for lipoid disorders: Secondary | ICD-10-CM

## 2022-03-27 LAB — POCT URINALYSIS DIP (CLINITEK)
Bilirubin, UA: NEGATIVE
Glucose, UA: NEGATIVE mg/dL
Ketones, POC UA: NEGATIVE mg/dL
Leukocytes, UA: NEGATIVE
Nitrite, UA: NEGATIVE
POC PROTEIN,UA: NEGATIVE
Spec Grav, UA: 1.025 (ref 1.010–1.025)
Urobilinogen, UA: 0.2 E.U./dL
pH, UA: 7 (ref 5.0–8.0)

## 2022-03-27 NOTE — Patient Instructions (Signed)
Thank you for choosing Primary Care at [facility] for your medical home!   ? ?Ann Rice was seen by Camillia Herter, NP today.  ? ?Rose Fillers primary care provider is Camillia Herter, NP.   ?For the best care possible,  you should try to see Durene Fruits, NP ?whenever you come to office.  ? ?We look forward to seeing you again soon! ? ?If you have any questions about your visit today,  ?please call us at [phone] ? ?Or feel free to reach your provider via [facility].   ? ?Preventive Care 29-58 Years Old, Female ?Preventive care refers to lifestyle choices and visits with your health care provider that can promote health and wellness. Preventive care visits are also called wellness exams. ?What can I expect for my preventive care visit? ?Counseling ?During your preventive care visit, your health care provider may ask about your: ?Medical history, including: ?Past medical problems. ?Family medical history. ?Pregnancy history. ?Current health, including: ?Menstrual cycle. ?Method of birth control. ?Emotional well-being. ?Home life and relationship well-being. ?Sexual activity and sexual health. ?Lifestyle, including: ?Alcohol, nicotine or tobacco, and drug use. ?Access to firearms. ?Diet, exercise, and sleep habits. ?Work and work Statistician. ?Sunscreen use. ?Safety issues such as seatbelt and bike helmet use. ?Physical exam ?Your health care provider may check your: ?Height and weight. These may be used to calculate your BMI (body mass index). BMI is a measurement that tells if you are at a healthy weight. ?Waist circumference. This measures the distance around your waistline. This measurement also tells if you are at a healthy weight and may help predict your risk of certain diseases, such as type 2 diabetes and high blood pressure. ?Heart rate and blood pressure. ?Body temperature. ?Skin for abnormal spots. ?What immunizations do I need? ?Vaccines are usually given at various ages, according to a  schedule. Your health care provider will recommend vaccines for you based on your age, medical history, and lifestyle or other factors, such as travel or where you work. ?What tests do I need? ?Screening ?Your health care provider may recommend screening tests for certain conditions. This may include: ?Pelvic exam and Pap test. ?Lipid and cholesterol levels. ?Diabetes screening. This is done by checking your blood sugar (glucose) after you have not eaten for a while (fasting). ?Hepatitis B test. ?Hepatitis C test. ?HIV (human immunodeficiency virus) test. ?STI (sexually transmitted infection) testing, if you are at risk. ?BRCA-related cancer screening. This may be done if you have a family history of breast, ovarian, tubal, or peritoneal cancers. ?Talk with your health care provider about your test results, treatment options, and if necessary, the need for more tests. ?Follow these instructions at home: ?Eating and drinking ? ?Eat a healthy diet that includes fresh fruits and vegetables, whole grains, lean protein, and low-fat dairy products. ?Take vitamin and mineral supplements as recommended by your health care provider. ?Do not drink alcohol if: ?Your health care provider tells you not to drink. ?You are pregnant, may be pregnant, or are planning to become pregnant. ?If you drink alcohol: ?Limit how much you have to 0-1 drink a day. ?Know how much alcohol is in your drink. In the [place] one drink equals one 12 oz bottle of beer (355 mL), one 5 oz glass of wine (148 mL), or one 1? oz glass of hard liquor (44 mL). ?Lifestyle ?Brush your teeth every morning and night with fluoride toothpaste. Floss one time each day. ?Exercise for at least 30 minutes 5 or  more days each week. ?Do not use any products that contain nicotine or tobacco. These products include cigarettes, chewing tobacco, and vaping devices, such as e-cigarettes. If you need help quitting, ask your health care provider. ?Do not use drugs. ?If you are  sexually active, practice safe sex. Use a condom or other form of protection to prevent STIs. ?If you do not wish to become pregnant, use a form of birth control. If you plan to become pregnant, see your health care provider for a prepregnancy visit. ?Find healthy ways to manage stress, such as: ?Meditation, yoga, or listening to music. ?Journaling. ?Talking to a trusted person. ?Spending time with friends and family. ?Minimize exposure to UV radiation to reduce your risk of skin cancer. ?Safety ?Always wear your seat belt while driving or riding in a vehicle. ?Do not drive: ?If you have been drinking alcohol. Do not ride with someone who has been drinking. ?If you have been using any mind-altering substances or drugs. ?While texting. ?When you are tired or distracted. ?Wear a helmet and other protective equipment during sports activities. ?If you have firearms in your house, make sure you follow all gun safety procedures. ?Seek help if you have been physically or sexually abused. ?What's next? ?Go to your health care provider once a year for an annual wellness visit. ?Ask your health care provider how often you should have your eyes and teeth checked. ?Stay up to date on all vaccines. ?This information is not intended to replace advice given to you by your health care provider. Make sure you discuss any questions you have with your health care provider. ?Document Revised: 05/30/2021 Document Reviewed: 05/30/2021 ?Elsevier Patient Education ? [place]. ? ?

## 2022-03-27 NOTE — Progress Notes (Signed)
Pt presents to establish care and annual physical exam w/o pap  ?Having complaints of frequent urination ?Pt completed pap in February w/gyn w/labs will update system  ?

## 2022-03-27 NOTE — Progress Notes (Signed)
?  Subjective:  ?Patient ID: Ann Rice, female    DOB: March 30, 1991,  MRN: [id number] ? ?Chief Complaint  ?Patient presents with  ? Nail Problem  ?   NP Bilateral Great Toe Fungus  ? ? ?31 y.o. female presents with the above complaint. History confirmed with patient.  She presents with discoloration of the bilateral great toenails may be have gotten from a pedicure. ? ?Objective:  ?Physical Exam: ?warm, good capillary refill, no trophic changes or ulcerative lesions, normal DP and PT pulses, normal sensory exam, and onychomycosis. ? ? ? ? ? ?Assessment:  ? ?1. Onychomycosis   ? ? ? ?Plan:  ?Patient was evaluated and treated and all questions answered. ? ?Discussed oral topical and laser treatment for onychomycosis in detail.  We discussed treating with an oral medication.  I recommended Lamisil 90-day course.  Photographs were taken.  I will see her back in 4 months for follow-up ? ?Return in about 4 months (around 07/26/2022) for follow up after nail fungus treatment.  ? ?

## 2022-03-27 NOTE — Progress Notes (Signed)
No urinary tract infection.

## 2022-03-28 LAB — CERVICOVAGINAL ANCILLARY ONLY
Bacterial Vaginitis (gardnerella): NEGATIVE
Candida Glabrata: NEGATIVE
Candida Vaginitis: NEGATIVE
Chlamydia: NEGATIVE
Comment: NEGATIVE
Comment: NEGATIVE
Comment: NEGATIVE
Comment: NEGATIVE
Comment: NEGATIVE
Comment: NORMAL
Neisseria Gonorrhea: NEGATIVE
Trichomonas: NEGATIVE

## 2022-03-28 NOTE — Progress Notes (Signed)
Gonorrhea, Chlamydia, Trichomonas, Bacterial Vaginitis, Candida Vaginitis (yeast infection) negative.

## 2022-03-29 ENCOUNTER — Encounter: Payer: Self-pay | Admitting: Family

## 2022-04-02 NOTE — Progress Notes (Signed)
? ?NEUROLOGY FOLLOW UP OFFICE NOTE ? ?Yared Susan ?[id number] ? ?Assessment/Plan:  ? ?Chronic tension type headache, not intractable ? ?Start zonisamide '25mg'$  daily titrating to '100mg'$  daily ?At earliest onset of neck pain, may take cyclobenzaprine 5-'10mg'$ .  May use Excedrin ?Limit use of pain relievers to no more than 2 days out of week to prevent risk of rebound or medication-overuse headache. ?Keep headache diary ?Caffeine cessation and increase water intake ?Follow up 5 months. ? ?  ?Subjective:  ?Kashay Cavenaugh is a 31 year old right-handed female with psoriasis who follows up for headaches ? ?UPDATE: ?Stopped topiramate and triptan in the summer of 2022.  Migraines improved.  Last migraine occurred a month ago.  However, she reports experiencing increased tension-type headaches in last month.  She describes mild-moderate pressure from back of neck and involving on the head (various locations).  She is able to function with them.  Usually occurs later in the afternoon when she leaves work.  She starts feeling "brain fog"  They last from 3 PM until she goes to bed.  Usually doesn't take anything for it.  Occurring about 5 days a week.  She is concerned that it is a side effect of the Viibryd which she doesn't want to stop because it is helpful.   ? ? ?Frequency of abortive medication: Excedrin once a week.   ?Current NSAIDS/analgesics: Excedrin ?Current triptans:  none ?Current ergotamine:  none ?Current anti-emetic:  none ?Current muscle relaxants:  none ?Current Antihypertensive medications:  none ?Current Antidepressant medications:  Viibryd ?Current Anticonvulsant medications:  none ?Current anti-CGRP:  none ?Current Vitamins/Herbal/Supplements:  Ferrous sulfate ?Current Antihistamines/Decongestants:   ?Other therapy:  none ?Hormone/birth control:  medroxyprogesterone ?Other medications:  Latuda ? ?Drinks 1 iced coffee in morning and sweet tea throughout the day.  Does not drink water.  Sometimes skips  meals.  She is able to fall asleep but wakes up once or twice in the night but able to quickly fall asleep.  She used to be able to sleep through the night.   ? ? ?HISTORY:  ?She had had headaches as a child which eventually resolved.  She started having headaches again in late [facility] December 2021.  She describes a severe pulsating pain in the left parietal region with nausea, photophobia, phonophobia, blurred vision and sometimes vomiting.  No numbness or weakness.  They would last 10-12 hours and occur 3 to 4 times a week.  One time, she had an intractable headache lasting 2 1/2 to 3 days, requiring a Toradol shot.  She was unable to drive home from work.  Triggers include stress, caffeine withdrawal and possibly vaping.  Sleep helped relieve it.  She thinks it work-related stress was the primary trigger.  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  She left her job in late January and headaches pretty much resolved.  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   ? ?  ?Past NSAIDS/analgesics:  none ?Past abortive triptans:  sumatriptan, rizatriptan ?Past abortive ergotamine:  none ?Past muscle relaxants:  none ?Past anti-emetic:  Zofran, promethazine ?Past antihypertensive medications:  propranolol ?Past antidepressant medications:  Citalopram '30mg'$  QD ?Past anticonvulsant medications:  topiramate '50mg'$  at bedtime ?Past anti-CGRP:  none ?Past vitamins/Herbal/Supplements:  none ?Past antihistamines/decongestants:  Loratidine, hydroxyzine ?Other past therapies:  none ? ? ?PAST MEDICAL HISTORY: ?Past Medical History:  ?Diagnosis Date  ?  Anxiety   ? Depression   ? Psoriasis   ? ? ?MEDICATIONS: ?Current Outpatient Medications on File Prior to Visit  ?Medication Sig Dispense Refill  ? Cholecalciferol 125 MCG (5000 UT) TABS Vitamin D3 125 mcg (5,000 unit) tablet ? Take 1 tablet every day by  oral route.    ? ibuprofen (ADVIL,MOTRIN) 800 MG tablet Take 1 tablet (800 mg total) by mouth 3 (three) times daily. (Patient not taking: Reported on 08/18/2019) 21 tablet 0  ? loratadine (CLARITIN) 10 MG tablet loratadine 10 mg tablet ? TAKE 1 TABLET BY MOUTH ONCE DAILY    ? medroxyPROGESTERone (DEPO-PROVERA) 150 MG/ML injection Inject 1 mL (150 mg total) into the muscle every 3 (three) months. 1 mL 3  ? medroxyPROGESTERone Acetate 150 MG/ML SUSY medroxyprogesterone 150 mg/mL intramuscular syringe ? '150mg'$  q 3 mo......Marland KitchenGiven: _ Gm-----Next inj Due:    ? promethazine (PHENERGAN) 25 MG tablet promethazine 25 mg tablet ? TAKE 1 TABLET BY MOUTH EVERY 4 HOURS    ? rizatriptan (MAXALT) 10 MG tablet rizatriptan 10 mg tablet    ? terbinafine (LAMISIL) 250 MG tablet Take 1 tablet (250 mg total) by mouth daily. 90 tablet 0  ? Vilazodone HCl (VIIBRYD) 40 MG TABS Take by mouth daily.    ? ?No current facility-administered medications on file prior to visit.  ? ? ?ALLERGIES: ?No Known Allergies ? ?FAMILY HISTORY: ?Family History  ?Problem Relation Age of Onset  ? Anxiety disorder Mother   ? Cancer Maternal Grandfather   ?     lung   ? Cancer Paternal Grandmother 24  ?     lung cancer   ? Diabetes Neg Hx   ? Heart disease Neg Hx   ? ? ?  ?Objective:  ?Blood pressure 109/75, pulse 93, height '5\' 3"'$  (1.6 m), weight 210 lb 6.4 oz (95.4 kg), SpO2 100 %. ?General: No acute distress.  Patient appears well-groomed.   ?Head:  Normocephalic/atraumatic ?Eyes:  Fundi examined but not visualized ?Neck: supple, mild mid paraspinal tenderness, full range of motion ?Heart:  Regular rate and rhythm ?Lungs:  Clear to auscultation bilaterally ?Back: No paraspinal tenderness ?Neurological Exam: alert and oriented to person, place, and time.  Speech fluent and not dysarthric, language intact.  CN II-XII intact. Bulk and tone normal, muscle strength 5/5 throughout.  Sensation to light touch intact.  Deep tendon reflexes 2+ throughout, toes downgoing.   Finger to nose testing intact.  Gait normal, Romberg negative. ? ? ?Metta Clines, DO ? ?CC: Durene Fruits, NP ? ? ? ? ? ? ?

## 2022-04-03 ENCOUNTER — Encounter: Payer: Self-pay | Admitting: Neurology

## 2022-04-03 ENCOUNTER — Ambulatory Visit: Payer: Medicaid Other | Admitting: Neurology

## 2022-04-03 VITALS — BP 109/75 | HR 93 | Ht 63.0 in | Wt 210.4 lb

## 2022-04-03 DIAGNOSIS — G44229 Chronic tension-type headache, not intractable: Secondary | ICD-10-CM | POA: Diagnosis not present

## 2022-04-03 DIAGNOSIS — M542 Cervicalgia: Secondary | ICD-10-CM

## 2022-04-03 MED ORDER — ZONISAMIDE 25 MG PO CAPS: ORAL_CAPSULE | ORAL | 0 refills | Status: DC

## 2022-04-03 MED ORDER — CYCLOBENZAPRINE HCL 10 MG PO TABS: 10.0000 mg | ORAL_TABLET | Freq: Three times a day (TID) | ORAL | 5 refills | Status: DC | PRN

## 2022-04-03 NOTE — Patient Instructions (Signed)
Start zonisamide '25mg'$  - increase dose as directed to '100mg'$  daily.  If no improvement after 4 weeks on '100mg'$  daily, contact me and we can increase dose ?Cyclobenzaprine '10mg'$  up to 3 times daily as needed to take when neck tension starts.  Caution for drowsiness.  If it does cause too much drowsiness, take 1/2 tablet ?Limit use of pain relievers to no more than 2 days out of week to prevent risk of rebound or medication-overuse headache. ?Keep headache diary ?Stop caffeine intake and increase water intake ?Follow up 5 months. ?

## 2022-04-03 NOTE — Addendum Note (Signed)
Addended byTomi Likens, Izick Gasbarro R on: 04/03/2022 02:10 PM ? ? Modules accepted: Orders ? ?

## 2022-04-16 ENCOUNTER — Other Ambulatory Visit: Payer: Self-pay

## 2022-04-17 ENCOUNTER — Encounter: Payer: Self-pay | Admitting: Gastroenterology

## 2022-04-17 ENCOUNTER — Ambulatory Visit: Payer: Medicaid Other | Admitting: Gastroenterology

## 2022-04-17 VITALS — BP 152/85 | HR 125 | Temp 99.6°F | Ht 63.0 in | Wt 207.5 lb

## 2022-04-17 DIAGNOSIS — R195 Other fecal abnormalities: Secondary | ICD-10-CM | POA: Diagnosis not present

## 2022-04-17 NOTE — Progress Notes (Signed)
?  ?Cephas Darby, MD ?[address]  ?[address]  ?[place], [place] [address]  ?Main: [phone]  ?Fax: [phone] ? ? ? ?Gastroenterology Consultation ? ?Referring Provider:     No ref. provider found ?Primary Care Physician:  Camillia Herter, NP ?Primary Gastroenterologist:  Dr. Cephas Darby ?Reason for Consultation: Loose stools ?      ? HPI:   ?Ann Rice is a 31 y.o. female referred by Dr. Minette Brine, Flonnie Hailstone, NP  for consultation & management of loose stools.  Patient reports that she had a migraine headache in August, tested positive for COVID.  She noticed that since then she has been having loose brown bowel movements, 1-2 times daily, feels gassy all the time.  She also experiences vomiting of stomach.  She denies any abdominal bloating, abdominal pain, nausea or vomiting.  She denies any rectal bleeding.  She was trying to associate her symptoms with COVID.  She also reports having mental health issues including anxiety and depression and wondering if her symptoms are secondary to that and she is not on any treatment for it.  She is currently being treated for her migraine headaches.  She had labs done in February 2023 including CBC, CMP, TSH, hemoglobin A1c which are unremarkable. ? ?She does vape nicotine approximately started 3 years ago ?She does not smoke marijuana, denies any IV drug abuse, denies any alcohol use ? ?NSAIDs: Excedrin once a week for migraine headaches ? ?Antiplts/Anticoagulants/Anti thrombotics: None ? ?GI Procedures: None ?Father with lymphocytic colitis ? ?Past Medical History:  ?Diagnosis Date  ? Anxiety   ? Depression   ? Psoriasis   ? ? ?Past Surgical History:  ?Procedure Laterality Date  ? BLADDER SURGERY    ? CESAREAN SECTION N/A 08/20/2019  ? Procedure: CESAREAN SECTION;  Surgeon: Bobbye Charleston, MD;  Location: [facility] LD [facility]S;  Service: Obstetrics;  Laterality: N/A;  ? FOOT SURGERY Left 2008   ? great toe broken, pin placed and removed   ? TONSILLECTOMY    ? At age  of 77  ? WISDOM TOOTH EXTRACTION    ? ? ?Current Outpatient Medications:  ?  Aspirin-Acetaminophen-Caffeine (EXCEDRIN EXTRA STRENGTH PO), , Disp: , Rfl:  ?  Cholecalciferol 125 MCG (5000 UT) TABS, Vitamin D3 125 mcg (5,000 unit) tablet  Take 1 tablet every day by oral route., Disp: , Rfl:  ?  cyclobenzaprine (FLEXERIL) 10 MG tablet, Take 1 tablet (10 mg total) by mouth 3 (three) times daily as needed for muscle spasms., Disp: 90 tablet, Rfl: 5 ?  loratadine (CLARITIN) 10 MG tablet, loratadine 10 mg tablet  TAKE 1 TABLET BY MOUTH ONCE DAILY, Disp: , Rfl:  ?  medroxyPROGESTERone Acetate 150 MG/ML SUSY, Inject 1 mL into the muscle every 3 (three) months., Disp: , Rfl:  ?  terbinafine (LAMISIL) 250 MG tablet, Take 1 tablet (250 mg total) by mouth daily., Disp: 90 tablet, Rfl: 0 ?  Vilazodone HCl (VIIBRYD) 40 MG TABS, Take by mouth daily., Disp: , Rfl:  ?  zonisamide (ZONEGRAN) 25 MG capsule, Take 1 capsule daily for one week, then 2 capsules daily for one week, then 3 capsules daily for one week, then 4 capsules daily., Disp: 120 capsule, Rfl: 0 ? ?Family History  ?Problem Relation Age of Onset  ? Anxiety disorder Mother   ? Cancer Maternal Grandfather   ?     lung   ? Cancer Paternal Grandmother 96  ?     lung cancer   ?  Diabetes Neg Hx   ? Heart disease Neg Hx   ?  ? ?Social History  ? ?Tobacco Use  ? Smoking status: Former  ?  Types: Cigars  ?  Passive exposure: Current  ? Smokeless tobacco: Current  ?Vaping Use  ? Vaping Use: Every day  ? Substances: Nicotine  ?Substance Use Topics  ? Alcohol use: Yes  ?  Comment: social  ? Drug use: No  ? ? ?Allergies as of 04/17/2022  ? (No Known Allergies)  ? ? ?Review of Systems:    ?All systems reviewed and negative except where noted in HPI. ? ? Physical Exam:  ?BP (!) 152/85 (BP Location: Left Arm, Patient Position: Sitting, Cuff Size: Normal)   Pulse (!) 125   Temp 99.6 ?F (37.6 ?C) (Oral)   Ht '5\' 3"'$  (1.6 m)   Wt 207 lb 8 oz (94.1 kg)   BMI 36.76 kg/m?  ?No LMP recorded.  Patient has had an injection. ? ?General:   Alert,  Well-developed, well-nourished, pleasant and cooperative in NAD ?Head:  Normocephalic and atraumatic. ?Eyes:  Sclera clear, no icterus.   Conjunctiva pink. ?Ears:  Normal auditory acuity. ?Nose:  No deformity, discharge, or lesions. ?Mouth:  No deformity or lesions,oropharynx pink & moist. ?Neck:  Supple; no masses or thyromegaly. ?Lungs:  Respirations even and unlabored.  Clear throughout to auscultation.   No wheezes, crackles, or rhonchi. No acute distress. ?Heart:  Regular rate and rhythm; no murmurs, clicks, rubs, or gallops. ?Abdomen:  Normal bowel sounds. Soft, non-tender and non-distended without masses, hepatosplenomegaly or hernias noted.  No guarding or rebound tenderness.   ?Rectal: Not performed ?Msk:  Symmetrical without gross deformities. Good, equal movement & strength bilaterally. ?Pulses:  Normal pulses noted. ?Extremities:  No clubbing or edema.  No cyanosis. ?Neurologic:  Alert and oriented x3;  grossly normal neurologically. ?Skin:  Intact without significant lesions or rashes. No jaundice. ?Psych:  Alert and cooperative. Normal mood and affect. ? ?Imaging Studies: ?No abdominal imaging recently ? ?Assessment and Plan:  ? ?Ann Rice is a 31 y.o. female with history of anxiety, depression is seen in consultation for nonbloody loose stools, gurgling in stomach without any other constitutional symptoms.  The symptoms of started since she was diagnosed with COVID in August 2022.  Patient also admits to consumption of sweet tea on a regular basis, she is trying to adapt eating healthy and exercise regularly ? ?Recommend stool studies to rule out infection ?Recommend H. pylori stool antigen ?If these are negative, recommend endoscopic evaluation ? ? ?Follow up based on the above work-up ? ? ?Cephas Darby, MD ? ?

## 2022-04-27 LAB — H. PYLORI ANTIGEN, STOOL: H pylori Ag, Stl: NEGATIVE

## 2022-04-28 LAB — GI PROFILE, STOOL, PCR
Adenovirus F 40/41: NOT DETECTED
Astrovirus: NOT DETECTED
C difficile toxin A/B: NOT DETECTED
Campylobacter: NOT DETECTED
Cryptosporidium: DETECTED — AB
Cyclospora cayetanensis: NOT DETECTED
Entamoeba histolytica: NOT DETECTED
Enteroaggregative E coli: NOT DETECTED
Enteropathogenic E coli: NOT DETECTED
Enterotoxigenic E coli: NOT DETECTED
Giardia lamblia: NOT DETECTED
Norovirus GI/GII: NOT DETECTED
Plesiomonas shigelloides: NOT DETECTED
Rotavirus A: NOT DETECTED
Salmonella: NOT DETECTED
Sapovirus: NOT DETECTED
Shiga-toxin-producing E coli: NOT DETECTED
Shigella/Enteroinvasive E coli: NOT DETECTED
Vibrio cholerae: NOT DETECTED
Vibrio: NOT DETECTED
Yersinia enterocolitica: NOT DETECTED

## 2022-04-29 ENCOUNTER — Telehealth: Payer: Self-pay

## 2022-04-29 ENCOUNTER — Other Ambulatory Visit: Payer: Self-pay | Admitting: Gastroenterology

## 2022-04-29 DIAGNOSIS — A072 Cryptosporidiosis: Secondary | ICD-10-CM

## 2022-04-29 MED ORDER — NITAZOXANIDE 500 MG PO TABS: 500.0000 mg | ORAL_TABLET | Freq: Two times a day (BID) | ORAL | 0 refills | Status: AC

## 2022-04-29 NOTE — Telephone Encounter (Signed)
-----   Message from Lin Landsman, MD sent at 04/28/2022 11:38 PM EDT ----- ?Caryl Pina ? 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  I do not see HIV status on her.  I recommend HIV test ? ?I also recommend treatment given that she has been having symptoms for more than 2 weeks.  Please send in prescription for nitazoxanide 500 mg every 12 hours for 3 days.  ? ?Amy ? ?FYI for you ? ?Thanks ?Rohini Vanga ?  ?

## 2022-04-29 NOTE — Telephone Encounter (Signed)
Patient verbalized understanding of results. Patient states she can not come for labs till Thursday. Sent medication to the pharmacy  ?

## 2022-04-29 NOTE — Telephone Encounter (Signed)
Called and left a message for call back. Which HIV testing did you want to order? ?

## 2022-04-29 NOTE — Telephone Encounter (Signed)
I just ordered it, please release ? ?RV ?

## 2022-04-29 NOTE — Addendum Note (Signed)
Addended by: Ulyess Blossom L on: 04/29/2022 10:48 AM ? ? Modules accepted: Orders ? ?

## 2022-04-29 NOTE — Telephone Encounter (Signed)
Release labs  ?

## 2022-05-01 ENCOUNTER — Encounter: Payer: Self-pay | Admitting: Gastroenterology

## 2022-05-07 ENCOUNTER — Other Ambulatory Visit: Payer: Self-pay | Admitting: Neurology

## 2022-05-07 LAB — HIV ANTIBODY (ROUTINE TESTING W REFLEX): HIV Screen 4th Generation wRfx: NONREACTIVE

## 2022-05-07 MED ORDER — ZONISAMIDE 25 MG PO CAPS: ORAL_CAPSULE | ORAL | 0 refills | Status: DC

## 2022-05-14 ENCOUNTER — Encounter: Payer: Self-pay | Admitting: Gastroenterology

## 2022-05-14 DIAGNOSIS — A072 Cryptosporidiosis: Secondary | ICD-10-CM

## 2022-05-14 NOTE — Telephone Encounter (Signed)
Do you want to schedule a follow up and over book or do you want me to schedule it in October

## 2022-05-21 LAB — GI PROFILE, STOOL, PCR
Adenovirus F 40/41: DETECTED — AB
Astrovirus: NOT DETECTED
C difficile toxin A/B: NOT DETECTED
Campylobacter: NOT DETECTED
Cryptosporidium: NOT DETECTED
Cyclospora cayetanensis: NOT DETECTED
Entamoeba histolytica: NOT DETECTED
Enteroaggregative E coli: NOT DETECTED
Enteropathogenic E coli: NOT DETECTED
Enterotoxigenic E coli: NOT DETECTED
Giardia lamblia: NOT DETECTED
Norovirus GI/GII: NOT DETECTED
Plesiomonas shigelloides: NOT DETECTED
Rotavirus A: NOT DETECTED
Salmonella: NOT DETECTED
Sapovirus: NOT DETECTED
Shiga-toxin-producing E coli: NOT DETECTED
Shigella/Enteroinvasive E coli: NOT DETECTED
Vibrio cholerae: NOT DETECTED
Vibrio: NOT DETECTED
Yersinia enterocolitica: NOT DETECTED

## 2022-05-22 ENCOUNTER — Telehealth: Payer: Self-pay

## 2022-05-22 NOTE — Telephone Encounter (Signed)
Called and left a message for call back. Sent mychart message  °

## 2022-05-22 NOTE — Telephone Encounter (Signed)
Patient verbalized understanding of results. Patient states the first time she submitted the sample  Cryptosporidium and now the Adenovirus. She wants to know if she going to keep coming in contact with different  virus every time she has diarrhea and she will come up positive for something. 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 She does not do much out side of work. Has a 29 year old but he does not go to day care he stays with her mom. He does have appointments with Speech therapy and Occupational therapy  because he has autism.

## 2022-05-22 NOTE — Telephone Encounter (Signed)
-----   Message from Lin Landsman, MD sent at 05/21/2022  5:10 PM EDT ----- Please inform patient that stool studies came back positive for adenovirus which explains her diarrhea. There is no treatment for adenovirus and is usually a self-limited illness  RV

## 2022-05-22 NOTE — Telephone Encounter (Signed)
She is likely being exposed to infection in these various settings.  Recommend frequent washing before handling food and sanitizing the surfaces regularly.  If her diarrhea recurs, recommend upper endoscopy and colonoscopy for further evaluation after ruling out infection again  RV

## 2022-06-02 ENCOUNTER — Other Ambulatory Visit: Payer: Self-pay | Admitting: Neurology

## 2022-06-03 ENCOUNTER — Other Ambulatory Visit: Payer: Self-pay | Admitting: Neurology

## 2022-06-03 MED ORDER — ZONISAMIDE 100 MG PO CAPS
100.0000 mg | ORAL_CAPSULE | Freq: Every day | ORAL | 5 refills | Status: DC
Start: 2022-06-03 — End: 2022-12-17

## 2022-06-23 ENCOUNTER — Other Ambulatory Visit: Payer: Self-pay | Admitting: Podiatry

## 2022-06-25 MED ORDER — TERBINAFINE HCL 250 MG PO TABS: 250.0000 mg | ORAL_TABLET | Freq: Every day | ORAL | 0 refills | Status: DC

## 2022-07-29 ENCOUNTER — Ambulatory Visit: Payer: Medicaid Other | Admitting: Podiatry

## 2022-07-29 DIAGNOSIS — B351 Tinea unguium: Secondary | ICD-10-CM | POA: Diagnosis not present

## 2022-07-29 NOTE — Progress Notes (Signed)
  Subjective:  Patient ID: Ann Rice, female    DOB: [birth year],  MRN: [id number]  Chief Complaint  Patient presents with   Nail Problem    Patient reports toenails have grown out some and she has noticed some nail improvement but the toenails are not completely clear.     31 y.o. female presents with the above complaint. History confirmed with patient.  She notes some improvement  Objective:  Physical Exam: warm, good capillary refill, no trophic changes or ulcerative lesions, normal DP and PT pulses, normal sensory exam, and onychomycosis, much improved since last visit there is new nail growing in approximately.       Assessment:   1. Onychomycosis       Plan:  Patient was evaluated and treated and all questions answered.  Overall quite a bit of improvement.  She has another refill that she recently picked up and will begin taking this.  I discussed with her the attached nail plate likely will be set at some point.  I will see her back in 3 months for review.  If it does not improve or becomes painful and gives her issues before this to let me know and we will plan to remove part of the nail plate with local anesthesia if needed  Return in about 3 months (around 10/29/2022) for follow up after nail fungus treatment.

## 2022-09-11 ENCOUNTER — Emergency Department (HOSPITAL_BASED_OUTPATIENT_CLINIC_OR_DEPARTMENT_OTHER): Payer: Medicaid Other

## 2022-09-11 ENCOUNTER — Encounter (HOSPITAL_BASED_OUTPATIENT_CLINIC_OR_DEPARTMENT_OTHER): Payer: Self-pay | Admitting: Emergency Medicine

## 2022-09-11 ENCOUNTER — Emergency Department (HOSPITAL_BASED_OUTPATIENT_CLINIC_OR_DEPARTMENT_OTHER)
Admission: EM | Admit: 2022-09-11 | Discharge: 2022-09-11 | Disposition: A | Discharge status: Home / Self Care | Payer: Medicaid Other | Attending: Emergency Medicine | Admitting: Emergency Medicine

## 2022-09-11 ENCOUNTER — Encounter: Payer: Self-pay | Admitting: Gastroenterology

## 2022-09-11 ENCOUNTER — Other Ambulatory Visit: Payer: Self-pay

## 2022-09-11 DIAGNOSIS — R1031 Right lower quadrant pain: Secondary | ICD-10-CM

## 2022-09-11 DIAGNOSIS — K5900 Constipation, unspecified: Secondary | ICD-10-CM

## 2022-09-11 LAB — CBC
HCT: 40.3 % (ref 36.0–46.0)
Hemoglobin: 13.8 g/dL (ref 12.0–15.0)
MCH: 29.7 pg (ref 26.0–34.0)
MCHC: 34.2 g/dL (ref 30.0–36.0)
MCV: 86.9 fL (ref 80.0–100.0)
Platelets: 348 10*3/uL (ref 150–400)
RBC: 4.64 MIL/uL (ref 3.87–5.11)
RDW: 13.7 % (ref 11.5–15.5)
WBC: 8 10*3/uL (ref 4.0–10.5)
nRBC: 0 % (ref 0.0–0.2)

## 2022-09-11 LAB — URINALYSIS, ROUTINE W REFLEX MICROSCOPIC
Bilirubin Urine: NEGATIVE
Glucose, UA: NEGATIVE mg/dL
Hgb urine dipstick: NEGATIVE
Ketones, ur: NEGATIVE mg/dL
Nitrite: NEGATIVE
Specific Gravity, Urine: 1.018 (ref 1.005–1.030)
pH: 8 (ref 5.0–8.0)

## 2022-09-11 LAB — COMPREHENSIVE METABOLIC PANEL
ALT: 20 U/L (ref 0–44)
AST: 16 U/L (ref 15–41)
Albumin: 4.8 g/dL (ref 3.5–5.0)
Alkaline Phosphatase: 65 U/L (ref 38–126)
Anion gap: 11 (ref 5–15)
BUN: 10 mg/dL (ref 6–20)
CO2: 24 mmol/L (ref 22–32)
Calcium: 9.5 mg/dL (ref 8.9–10.3)
Chloride: 106 mmol/L (ref 98–111)
Creatinine, Ser: 0.9 mg/dL (ref 0.44–1.00)
GFR, Estimated: >60 mL/min (ref >60)
Glucose, Bld: 99 mg/dL (ref 70–99)
Potassium: 3.7 mmol/L (ref 3.5–5.1)
Sodium: 141 mmol/L (ref 135–145)
Total Bilirubin: 0.4 mg/dL (ref 0.3–1.2)
Total Protein: 8 g/dL (ref 6.5–8.1)

## 2022-09-11 LAB — LIPASE, BLOOD: Lipase: 19 U/L (ref 11–51)

## 2022-09-11 LAB — PREGNANCY, URINE: Preg Test, Ur: NEGATIVE

## 2022-09-11 MED ORDER — IOHEXOL 300 MG/ML  SOLN
100.0000 mL | Freq: Once | INTRAMUSCULAR | Status: AC | PRN
  Administered 2022-09-11: 80 mL via INTRAVENOUS

## 2022-09-11 MED ORDER — POLYETHYLENE GLYCOL 3350 17 G PO PACK: 17.0000 g | PACK | Freq: Every day | ORAL | 0 refills | Status: DC

## 2022-09-11 NOTE — ED Provider Notes (Signed)
[facility] Provider Note   CSN: [id number] Arrival date & time: 09/11/22  7371     History  Chief Complaint  Patient presents with   Abdominal Pain    Ann Rice is a 31 y.o. female.  The history is provided by the patient and medical records. No language interpreter was used.  Abdominal Pain Pain location:  RLQ and RUQ Pain quality: aching and cramping   Pain radiates to:  Does not radiate Pain severity:  No pain Onset quality:  Gradual Duration:  1 day Timing:  Constant Progression:  Unchanged Chronicity:  New Relieved by:  Nothing Worsened by:  Nothing Associated symptoms: constipation   Associated symptoms: no anorexia, no chest pain, no chills, no cough, no diarrhea, no dysuria, no fatigue, no fever, no nausea, no shortness of breath, no vaginal bleeding, no vaginal discharge and no vomiting        Home Medications Prior to Admission medications   Medication Sig Start Date End Date Taking? Authorizing Provider  Aspirin-Acetaminophen-Caffeine (EXCEDRIN EXTRA STRENGTH PO)     [provider]  Cholecalciferol 125 MCG (5000 UT) TABS Vitamin D3 125 mcg (5,000 unit) tablet  Take 1 tablet every day by oral route.    [provider]  cyclobenzaprine (FLEXERIL) 10 MG tablet Take 1 tablet (10 mg total) by mouth 3 (three) times daily as needed for muscle spasms. 04/03/22   Pieter Partridge, DO  loratadine (CLARITIN) 10 MG tablet loratadine 10 mg tablet  TAKE 1 TABLET BY MOUTH ONCE DAILY    [provider]  medroxyPROGESTERone Acetate 150 MG/ML SUSY Inject 1 mL into the muscle every 3 (three) months. 04/06/22   [provider]  terbinafine (LAMISIL) 250 MG tablet Take 1 tablet (250 mg total) by mouth daily. 06/25/22 09/23/22  McDonald, Stephan Minister, DPM  Vilazodone HCl (VIIBRYD) 40 MG TABS Take by mouth daily.    [provider]  zonisamide (ZONEGRAN) 100 MG capsule Take 1 capsule (100 mg total) by mouth  daily. 06/03/22   Pieter Partridge, DO      Allergies    Patient has no known allergies.    Review of Systems   Review of Systems  Constitutional:  Negative for chills, diaphoresis, fatigue and fever.  HENT:  Negative for congestion.   Respiratory:  Negative for cough, chest tightness, shortness of breath and wheezing.   Cardiovascular:  Negative for chest pain and palpitations.  Gastrointestinal:  Positive for abdominal pain and constipation. Negative for abdominal distention, anorexia, diarrhea, nausea and vomiting.  Genitourinary:  Negative for decreased urine volume, dysuria, flank pain, frequency, vaginal bleeding, vaginal discharge and vaginal pain.  Musculoskeletal:  Negative for back pain, neck pain and neck stiffness.  Skin:  Negative for rash and wound.  Neurological:  Negative for dizziness, light-headedness and headaches.  Psychiatric/Behavioral:  Negative for agitation.   All other systems reviewed and are negative.   Physical Exam Updated Vital Signs BP 128/79 (BP Location: Right Arm)   Pulse (!) 107   Temp 98.2 F (36.8 C) (Oral)   Resp 18   Ht '5\' 3"'$  (1.6 m)   Wt 95.3 kg   SpO2 100%   BMI 37.20 kg/m  Physical Exam Vitals and nursing note reviewed.  Constitutional:      General: She is not in acute distress.    Appearance: She is well-developed. She is not ill-appearing, toxic-appearing or diaphoretic.  HENT:     Head: Normocephalic and atraumatic.  Eyes:  Conjunctiva/sclera: Conjunctivae normal.  Cardiovascular:     Rate and Rhythm: Normal rate and regular rhythm.     Heart sounds: No murmur heard. Pulmonary:     Effort: Pulmonary effort is normal. No respiratory distress.     Breath sounds: Normal breath sounds. No wheezing, rhonchi or rales.  Chest:     Chest wall: No tenderness.  Abdominal:     General: Abdomen is flat. Bowel sounds are normal. There is no distension.     Palpations: Abdomen is soft.     Tenderness: There is abdominal tenderness  in the right upper quadrant and right lower quadrant. There is no right CVA tenderness or left CVA tenderness.  Musculoskeletal:        General: No swelling.     Cervical back: Neck supple.  Skin:    General: Skin is warm and dry.     Capillary Refill: Capillary refill takes less than 2 seconds.     Coloration: Skin is not pale.  Neurological:     General: No focal deficit present.     Mental Status: She is alert.  Psychiatric:        Mood and Affect: Mood normal.     ED Results / Procedures / Treatments   Labs (all labs ordered are listed, but only abnormal results are displayed) Labs Reviewed  URINALYSIS, ROUTINE W REFLEX MICROSCOPIC - Abnormal; Notable for the following components:      Result Value   APPearance CLOUDY (*)    Protein, ur TRACE (*)    Leukocytes,Ua MODERATE (*)    Bacteria, UA MANY (*)    All other components within normal limits  LIPASE, BLOOD  COMPREHENSIVE METABOLIC PANEL  CBC  PREGNANCY, URINE    EKG None  Radiology CT ABDOMEN PELVIS W CONTRAST  Result Date: 09/11/2022 CLINICAL DATA:  Right lower quadrant abdominal pain EXAM: CT ABDOMEN AND PELVIS WITH CONTRAST TECHNIQUE: Multidetector CT imaging of the abdomen and pelvis was performed using the standard protocol following bolus administration of intravenous contrast. RADIATION DOSE REDUCTION: This exam was performed according to the departmental dose-optimization program which includes automated exposure control, adjustment of the mA and/or kV according to patient size and/or use of iterative reconstruction technique. CONTRAST:  52m OMNIPAQUE IOHEXOL 300 MG/ML  SOLN COMPARISON:  10/06/2010 FINDINGS: Lower chest: No acute abnormality. Hepatobiliary: No focal liver abnormality is seen. No gallstones, gallbladder wall thickening, or biliary dilatation. Pancreas: Unremarkable. No pancreatic ductal dilatation or surrounding inflammatory changes. Spleen: Normal in size without focal abnormality.  Adrenals/Urinary Tract: Normal adrenal glands. No kidney stones, hydronephrosis, or mass identified. Urinary bladder appears unremarkable. Stomach/Bowel: Stomach appears within normal limits. The appendix is visualized and appears normal measuring 5 mm in thickness. There is no bowel wall thickening, inflammation, or distension. Moderate retained stool identified within the ascending colon and transverse colon. Vascular/Lymphatic: No significant vascular findings are present. No enlarged abdominal or pelvic lymph nodes. Reproductive: Uterus and bilateral adnexa are unremarkable. Other: No free fluid or fluid collections. Small fat containing umbilical hernia, image 458/0 Musculoskeletal: No acute or significant osseous findings. IMPRESSION: 1. No acute findings within the abdomen or pelvis. 2. Moderate retained stool identified within the ascending colon and transverse colon. Correlate for any clinical signs or symptoms of constipation. 3. Small fat containing umbilical hernia. Electronically Signed   By: TKerby MoorsM.D.   On: 09/11/2022 11:51    Procedures Procedures    Medications Ordered in ED Medications  iohexol (OMNIPAQUE) 300 MG/ML solution  100 mL (80 mLs Intravenous Contrast Given 09/11/22 1132)    ED Course/ Medical Decision Making/ A&P                           Medical Decision Making Amount and/or Complexity of Data Reviewed Labs: ordered. Radiology: ordered.  Risk Prescription drug management.    Ann Rice is a 31 y.o. female with a past medical history significant for anxiety who presents with right lower quadrant abdominal pain.  Patient reports he woke up this morning at around 6:30 AM with moderate severe pain in her right lower quadrant.  She denied nausea, vomiting, constipation, diarrhea, or urinary changes.  No vaginal complaints.  No personal history of ovarian torsion or ovarian troubles.  She reports a family history of some gallbladder troubles and ovarian  cyst but has no personal history.  She denies trauma.  She denies any fevers, chills, congestion, cough.  Denies any rashes or skin changes to suggest shingles.  She reports the pain was a 9 out of 10 in severity at times but is now only a 3 out of 10.  She does not want pain medicine.  She was sent here from urgent care to rule out appendicitis.  On exam, lungs clear and chest nontender.  Abdomen is tender primary in the right lower quadrant.  It is not painful in suprapubic area or other flanks.  No back tenderness.  Bowel sounds were appreciated.  Exam otherwise unremarkable.  Due to her lack of any pelvic pains we agreed to hold on pelvic evaluation exam at this time.  Patient had some labs in triage that were overall reassuring however with her persistent right lower quadrant tenderness, we will get CT scan to rule out appendicitis.  Anticipate reassessment after imaging to determine disposition.  She does not want medicine at this time.   CT scan returned showing a right sided ascending colon constipation and retained stool.  Otherwise no critical inflammation or appendicitis.  Work-up otherwise reassuring as we discussed and given lack of urinary symptoms doubt UTI.  Patient agrees with this work-up and we will give her prescription for MiraLAX.  She will follow-up with her PCP and understood return precautions.  She is with questions or concerns and was discharged in good condition.          Final Clinical Impression(s) / ED Diagnoses Final diagnoses:  RLQ abdominal pain  Constipation, unspecified constipation type    Rx / DC Orders ED Discharge Orders          Ordered    polyethylene glycol (MIRALAX / GLYCOLAX) 17 g packet  Daily        09/11/22 1353            Clinical Impression: 1. RLQ abdominal pain   2. Constipation, unspecified constipation type     Disposition: Discharge  Condition: Good  I have discussed the results, Dx and Tx plan with the pt(& family  if present). He/she/they expressed understanding and agree(s) with the plan. Discharge instructions discussed at great length. Strict return precautions discussed and pt &/or family have verbalized understanding of the instructions. No further questions at time of discharge.    New Prescriptions   POLYETHYLENE GLYCOL (MIRALAX / GLYCOLAX) 17 G PACKET    Take 17 g by mouth daily.    Follow Up: Camillia Herter, NP [place] [id number] [phone]     [facility]  Emergency Dept [place] [address] [phone]        Jahseh Lucchese, Gwenyth Allegra, MD 09/11/22 1357

## 2022-09-11 NOTE — ED Triage Notes (Addendum)
Pt caox4 and ambulatory. Pt reports she went to UC this morning c/o RLQ abdominal pain that started approx 0630 this morning. Pt denies N/V/D. Pt denies urinary s/s. Last BM Monday.

## 2022-09-11 NOTE — ED Notes (Signed)
Dc instructions reviewed with patient. Patient voiced understanding. Dc with belongings.  °

## 2022-09-11 NOTE — ED Notes (Signed)
Patient transported to CT 

## 2022-09-11 NOTE — Discharge Instructions (Signed)
Your history, exam, work-up today was overall reassuring but did show a moderate amount of stool in the right abdomen which is where you are having the pain suspicious for acute constipation.  Given your abnormal bowels recently, please take the MiraLAX to help get you regular and please make sure you are staying hydrated.  Please follow-up with your primary doctor.  If any symptoms change or worsen acutely, please return to the nearest emergency department.

## 2022-09-11 NOTE — ED Notes (Signed)
Pt given ice water, per Dr. Sherry Ruffing

## 2022-09-11 NOTE — ED Notes (Signed)
Pt returned from CT °

## 2022-09-12 NOTE — Progress Notes (Signed)
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 Keep headache diary Follow up with ENT regarding tinnitus and hearing loss  Follow up 4-5 months    Subjective:  Ann Rice is a 31 year old right-handed female with psoriasis who follows up for headaches  UPDATE: No migraines.  Due to increased tension-type headache, started zonisamide last visit.  They occur 2-3 a week.  Still has stiffness in neck.  Uses heating pad.    She notices noise in her right ear.  It is a fuzziness or ringing in the right ear.  Sound is a little muffled in the right ear.  Not constant.  Mainly noticeable at night.     Frequency of abortive medication: Excedrin once a week.   Current NSAIDS/analgesics: Excedrin (once or twice since last visit) Current triptans:  none Current ergotamine:  none Current anti-emetic:  none Current muscle relaxants:  Flexeril '10mg'$  during day and '20mg'$  at night. Current Antihypertensive medications:  none Current Antidepressant medications:  Viibryd Current Anticonvulsant medications:  none Current anti-CGRP:  none Current Vitamins/Herbal/Supplements:  Ferrous sulfate Current Antihistamines/Decongestants:   Other therapy:  heating pad to the neck Hormone/birth control:  medroxyprogesterone Other medications:  Latuda  Drinks 1 iced coffee in morning and sweet tea throughout the day.  Does not drink water.  Sometimes skips meals.  She is able to fall asleep but wakes up once or twice in the night but able to quickly fall asleep.  She used to be able to sleep through the night.      HISTORY:  She had had headaches as a child which eventually resolved.  She started having headaches again in late [facility] December 2021.  She describes a severe pulsating pain in the left parietal region with nausea, photophobia, phonophobia, blurred vision and sometimes vomiting.  No numbness or weakness.  They would last 10-12 hours and occur 3 to 4 times a week.  One time, she had an intractable headache lasting 2 1/2 to 3 days, requiring a Toradol shot.  She was unable to drive home from work.  Triggers include stress, caffeine withdrawal and possibly vaping.  Sleep helped relieve it.  She thinks it work-related stress was the primary trigger.  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  She left her job and headaches pretty much resolved.  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      Past NSAIDS/analgesics:  none Past abortive triptans:  sumatriptan, rizatriptan Past abortive ergotamine:  none Past muscle relaxants:  none Past anti-emetic:  Zofran, promethazine Past antihypertensive medications:  propranolol Past antidepressant medications:  Citalopram '30mg'$  QD Past anticonvulsant medications:  topiramate '50mg'$  at bedtime Past anti-CGRP:  none Past vitamins/Herbal/Supplements:  none Past antihistamines/decongestants:  Loratidine, hydroxyzine Other past therapies:  none  As a teenager, she   PAST MEDICAL HISTORY: Past Medical History:  Diagnosis Date   Anxiety    Depression    Psoriasis     MEDICATIONS: Current Outpatient Medications on  File Prior to Visit  Medication Sig Dispense Refill   Aspirin-Acetaminophen-Caffeine (EXCEDRIN EXTRA STRENGTH PO)      Cholecalciferol 125 MCG (5000 UT) TABS Vitamin D3 125 mcg (5,000 unit) tablet  Take 1 tablet every day by oral route.     cyclobenzaprine (FLEXERIL) 10 MG tablet Take 1 tablet (10 mg total) by  mouth 3 (three) times daily as needed for muscle spasms. 90 tablet 5   levonorgestrel (MIRENA) 20 MCG/DAY IUD 1 each by Intrauterine route once.     loratadine (CLARITIN) 10 MG tablet loratadine 10 mg tablet  TAKE 1 TABLET BY MOUTH ONCE DAILY     polyethylene glycol (MIRALAX / GLYCOLAX) 17 g packet Take 17 g by mouth daily. 14 each 0   terbinafine (LAMISIL) 250 MG tablet Take 1 tablet (250 mg total) by mouth daily. 90 tablet 0   Vilazodone HCl (VIIBRYD) 40 MG TABS Take by mouth daily.     zonisamide (ZONEGRAN) 100 MG capsule Take 1 capsule (100 mg total) by mouth daily. 30 capsule 5   No current facility-administered medications on file prior to visit.     ALLERGIES: No Known Allergies  FAMILY HISTORY: Family History  Problem Relation Age of Onset   Anxiety disorder Mother    Cancer Maternal Grandfather        lung    Cancer Paternal Grandmother 38       lung cancer    Diabetes Neg Hx    Heart disease Neg Hx       Objective:  Blood pressure 122/78, pulse (!) 119, height '5\' 3"'$  (1.6 m), weight 210 lb (95.3 kg), SpO2 98 %. General: No acute distress.  Patient appears well-groomed.     Metta Clines, DO  CC: Durene Fruits, NP

## 2022-09-13 NOTE — Telephone Encounter (Signed)
Called and left a message for call back. Patient called back she states she is going to see when her provider is off any in November and she will call us on Monday to schedule

## 2022-09-16 ENCOUNTER — Ambulatory Visit: Payer: Medicaid Other | Admitting: Neurology

## 2022-09-16 ENCOUNTER — Encounter: Payer: Self-pay | Admitting: Neurology

## 2022-09-16 VITALS — BP 122/78 | HR 119 | Ht 63.0 in | Wt 210.0 lb

## 2022-09-16 DIAGNOSIS — M542 Cervicalgia: Secondary | ICD-10-CM | POA: Diagnosis not present

## 2022-09-16 DIAGNOSIS — G43009 Migraine without aura, not intractable, without status migrainosus: Secondary | ICD-10-CM

## 2022-09-16 DIAGNOSIS — H9311 Tinnitus, right ear: Secondary | ICD-10-CM | POA: Diagnosis not present

## 2022-09-16 DIAGNOSIS — G44229 Chronic tension-type headache, not intractable: Secondary | ICD-10-CM | POA: Diagnosis not present

## 2022-09-17 ENCOUNTER — Other Ambulatory Visit: Payer: Self-pay

## 2022-09-17 DIAGNOSIS — R1031 Right lower quadrant pain: Secondary | ICD-10-CM

## 2022-09-17 DIAGNOSIS — M542 Cervicalgia: Secondary | ICD-10-CM

## 2022-09-17 MED ORDER — NA SULFATE-K SULFATE-MG SULF 17.5-3.13-1.6 GM/177ML PO SOLN: 354.0000 mL | Freq: Once | ORAL | 0 refills | Status: AC

## 2022-09-17 NOTE — Progress Notes (Signed)
Per Dr.Jaffe,Please refer Ms. Donahoo to neurorehab for physical therapy for neck pain.  Order added.

## 2022-09-23 ENCOUNTER — Other Ambulatory Visit: Payer: Self-pay | Admitting: Podiatry

## 2022-09-27 MED ORDER — TERBINAFINE HCL 250 MG PO TABS: 250.0000 mg | ORAL_TABLET | Freq: Every day | ORAL | 0 refills | Status: AC

## 2022-10-16 ENCOUNTER — Encounter: Payer: Self-pay | Admitting: Rehabilitation

## 2022-10-16 ENCOUNTER — Ambulatory Visit: Payer: Medicaid Other | Attending: Neurology | Admitting: Rehabilitation

## 2022-10-16 ENCOUNTER — Other Ambulatory Visit: Payer: Self-pay

## 2022-10-16 DIAGNOSIS — M542 Cervicalgia: Secondary | ICD-10-CM | POA: Insufficient documentation

## 2022-10-16 DIAGNOSIS — R293 Abnormal posture: Secondary | ICD-10-CM | POA: Insufficient documentation

## 2022-10-16 NOTE — Therapy (Signed)
OUTPATIENT PHYSICAL THERAPY CERVICAL EVALUATION   Patient Name: Naviah Belfield MRN: [id number] DOB:03-Feb-1991, 31 y.o., female Today's Date: 10/16/2022   PT End of Session - 10/16/22 1323     Visit Number 1    Number of Visits 9    Date for PT Re-Evaluation 12/15/22    Authorization Type [employer] (awaiting approval)    PT Start Time 1104    PT Stop Time 1153    PT Time Calculation (min) 49 min    Activity Tolerance Patient tolerated treatment well    Behavior During Therapy WFL for tasks assessed/performed             Past Medical History:  Diagnosis Date   Anxiety    Depression    Psoriasis    Past Surgical History:  Procedure Laterality Date   BLADDER SURGERY     CESAREAN SECTION N/A 08/20/2019   Procedure: CESAREAN SECTION;  Surgeon: Bobbye Charleston, MD;  Location: [facility];  Service: Obstetrics;  Laterality: N/A;   FOOT SURGERY Left 2008    great toe broken, pin placed and removed    TONSILLECTOMY     At age of [place] EXTRACTION     Patient Active Problem List   Diagnosis Date Noted   Psoriasis 03/26/2022   Urinary tract infectious disease 03/26/2022   Multiple benign melanocytic nevi 02/19/2022   Ear itch 02/08/2021   Macrosomia 08/18/2019   Contraception 10/04/2014   URI, acute 10/04/2014   High risk HPV infection 08/20/2012   Birth control counseling 03/04/2012   Contraception management 03/04/2012   Depo-Provera contraceptive status 03/04/2012   Anxiety 12/16/2010   Depressive disorder 12/16/2010    PCP: Durene Fruits, NP   REFERRING PROVIDER: Durene Fruits, NP   REFERRING DIAG: Neck pain   THERAPY DIAG:  Cervicalgia  Abnormal posture  Rationale for Evaluation and Treatment: Rehabilitation  ONSET DATE: Has been more pronounced in the last 3-4 months  SUBJECTIVE:                                                                                                                                                                                                          SUBJECTIVE STATEMENT: Pt reports symptoms have gradually increased over the last 3-4 months, likely due to increased stress. Tries to sleep on only one pillow.  Does endorse sleeping throughout the night.    PERTINENT HISTORY:  Sees cardiologist next week  PAIN:  Are you having pain? Yes: NPRS scale: 2/10 Pain location: mostly upper cervical spine,  sometimes into L ram horn pattern Pain description: achy, throbbing, bright lights, stiff  Aggravating factors: working (hurts more at end of work day), looking down Relieving factors: Heat  PRECAUTIONS: None  WEIGHT BEARING RESTRICTIONS: No  FALLS:  Has patient fallen in last 6 months? 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  Tenderness in B paraspinals throughout upper, middle and lower cervical spine.  Trigger points noted along upper trap.     CERVICAL ROM:   Active ROM A/PROM (deg) eval  Flexion 36 (some pain)  Extension 37  Right lateral flexion 40  Left lateral flexion 35  Right rotation 53  Left rotation 54   (Blank rows = not tested) reports tightness in all motions   UPPER EXTREMITY ROM:  Active ROM Right eval Left eval  Shoulder  flexion WFL (some pain) WFL   Shoulder extension WFL (some pain) WFL some pain  Shoulder abduction [facility]  [facility]  Shoulder adduction    Shoulder extension WFL (some pain) WFL some pain  Shoulder internal rotation WFL but painful WFL but painful  Shoulder external rotation [facility]  [facility]  Elbow flexion    Elbow extension    Wrist flexion    Wrist extension    Wrist ulnar deviation    Wrist radial deviation    Wrist pronation    Wrist supination     (Blank rows = not tested)  UPPER EXTREMITY MMT:  MMT Right eval Left eval  Shoulder flexion    Shoulder extension    Shoulder abduction  3/5 with pain in neck  Shoulder adduction    Shoulder extension    Shoulder internal rotation    Shoulder external rotation    Middle trapezius    Lower trapezius    Elbow flexion    Elbow extension    Wrist flexion    Wrist extension    Wrist ulnar deviation    Wrist radial deviation    Wrist pronation    Wrist supination    Grip strength     (Blank rows = not tested) All others grossly WFL  CERVICAL SPECIAL TESTS:  N/a    TODAY'S TREATMENT:  DATE: Initiated HEP (PT unable to locate code to paste into Epic) Performed seated upper trap stretch x30 secs each side, seated shoulder rolls x 10 reps, and hooklying on green pool noodle with arms in "T" position x 2 mins.       PATIENT EDUCATION:  Education details: See HEP details, POC, goals Person educated: Patient Education method: Explanation, Demonstration, and Handouts Education comprehension: verbalized understanding and needs further education  HOME EXERCISE PROGRAM: See above for details on HEP  ASSESSMENT:  CLINICAL IMPRESSION: Patient is a 31 y.o. female who was seen today for physical therapy evaluation and treatment for neck pain.  She endorses most pain located at base of skull around occiput and also  upper cervical spine bilaterally that sometimes goes into L head with headaches.  Note distant history of psorasis and she is seeing a cardiologist soon regarding other health issues.  Upon PT evaluation, note fairly good spinal mobility, but tightness/tenderness along cervical spine, upper traps and rhomboids with trigger points palpated and decreased cervical ROM.  Pt will benefit from skilled OP neuro PT in order to address deficits.      OBJECTIVE IMPAIRMENTS: decreased mobility, decreased ROM, hypomobility, impaired flexibility, improper body mechanics, and postural dysfunction.   ACTIVITY LIMITATIONS: carrying, lifting, and sleeping  PARTICIPATION LIMITATIONS: driving, community activity, occupation, and reading  PERSONAL FACTORS: Profession and 1-2 comorbidities: see above  are also affecting patient's functional outcome.   REHAB POTENTIAL: Good  CLINICAL DECISION MAKING: Evolving/moderate complexity  EVALUATION COMPLEXITY: Moderate   GOALS: Goals reviewed with patient? Yes  SHORT TERM GOALS: Target date: 11/13/2022   Pt will be IND with initial HEP in order to indicate improved functional mobility and dec fall risk. Baseline: dependent  Goal status: INITIAL  2.  Pt will improve cervical rotation by 7 deg bilaterally in order to indicate safe driving.  Baseline: 53 R, 54 L  Goal status: INITIAL  3.  Pt will report no more than 4/10 at worst (end of day) in order to indicate improved posture and flexibility.  Baseline: 6-7/10 at worst  Goal status: INITIAL  4.  Pt will have 5% reduction in NDI in order to indicate reduced pain with ADLs.  Baseline: 26% Goal status: INITIAL    LONG TERM GOALS: Target date: 12/15/2022  Pt will be IND with final HEP in order to indicate improved functional mobility and dec fall risk. Baseline: dependent  Goal status: INITIAL  2.  Pt will demonstrate 10 deg improvement bilaterally in cervical rotation to indicate improved flexibility  and safer driving.  Baseline: 53R, 54L  Goal status: INITIAL  3.  Pt will demonstrate 10% reduction in NDI score in order to indicate decreased pain with ADLs.  Baseline: 26% Goal status: INITIAL  4.  Pt will report no more than 3/10 pain at worst with 75% reduction in days needing heating pad at end of day.  Baseline: 6-7/10 and needing heat daily  Goal status: INITIAL  5.  Pt will verbalize and demo improved work Scientist, water quality and reading/phone strategies in order to reduce pain and improve posture.  Baseline: Sits at computer dest at work and uses phone/likes to read at home on couch.  Goal status: INITIAL     PLAN:  PT FREQUENCY: 1x/week  PT DURATION: 8 weeks  PLANNED INTERVENTIONS: Therapeutic exercises, Therapeutic activity, Neuromuscular re-education, Balance training, Gait training, Patient/Family education, Self Care, Joint mobilization, Dry Needling, Spinal manipulation, Spinal mobilization, Moist heat, Taping, Traction, Ultrasound, and Manual therapy  PLAN FOR NEXT SESSION: Add to HEP for cervical anterior chest stretching.  Work on Air cabin crew, postural education, work station ergonomics   Cameron Sprang, [place], [place] [address] [place], [place], [address] Phone: [phone]   Fax:  [phone] 10/16/22, 2:27 PM  Check all possible CPT codes: 97110- Therapeutic Exercise, [phone]- Neuro Re-education, 97140 - Manual Therapy, 97530 - Therapeutic Activities, 97535 - [place], [phone] - Mechanical traction, [phone] - Electrical stimulation (Manual), and [id number] - Ultrasound    Check all conditions that are expected to impact treatment: Musculoskeletal disorders   If treatment provided at initial evaluation, no treatment charged due to lack of authorization.

## 2022-10-21 NOTE — Progress Notes (Deleted)
Cardiology Office Note:    Date:  10/21/2022   ID:  Ann Rice, DOB 1991/01/20, MRN [id number]  PCP:  Rema Fendt, NP   [facility] Providers Cardiologist:  None {    Referring MD: Rema Fendt, NP    History of Present Illness:    Ann Rice is a 31 y.o. female with a hx of anxiety and depression who was referred by Ricky Stabs, NP for further evaluation of tachycardia.   Today, ***  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HCC)    Depression    Ear itch    Headache    Loose stools    Migraine    Psoriasis     Past Surgical History:  Procedure Laterality Date   BLADDER SURGERY     CESAREAN SECTION N/A 08/20/2019   Procedure: CESAREAN SECTION;  Surgeon: Carrington Clamp, MD;  Location: [facility];  Service: Obstetrics;  Laterality: N/A;   FOOT SURGERY Left 2008    great toe broken, pin placed and removed    TONSILLECTOMY     At age of 25   WISDOM TOOTH EXTRACTION      Current Medications: No outpatient medications have been marked as taking for the 10/23/22 encounter (Appointment) with Meriam Sprague, MD.     Allergies:   Mirena [levonorgestrel]   Social History   Socioeconomic History   Marital status: Divorced    Spouse name: Not on file   Number of children: 0    Years of education: some colle   Highest education level: Not on file  Occupational History   Occupation: Unemployed     Employer: [employer]  Tobacco Use   Smoking status: Former    Types: Cigars    Passive exposure: Current   Smokeless tobacco: Current  Vaping Use   Vaping Use: Every day   Substances: Nicotine  Substance and Sexual Activity   Alcohol use: Yes    Comment: social   Drug use: No   Sexual activity: Yes    Birth control/protection: Injection  Other Topics Concern   Not on file  Social History Narrative   Lives with husband.   Has 2 dogs.    Right handed   2 story home    Caffeine yes      Social Determinants of Health   Financial Resource Strain: Low Risk  (08/06/2019)   Overall Financial Resource Strain (CARDIA)    Difficulty of Paying Living Expenses: Not hard at all  Food Insecurity: No Food Insecurity (08/06/2019)   Hunger Vital Sign    Worried About Running Out of Food in the Last Year: Never true    Ran Out of Food in the Last Year: Never true  Transportation Needs: No Transportation Needs (08/06/2019)   PRAPARE - Administrator, Civil Service (Medical): No    Lack of Transportation (Non-Medical): No  Physical Activity: Not on file  Stress: Not on file  Social Connections: Not on file     Family History: The patient's ***family history includes Anxiety disorder in her mother; Cancer in her maternal grandfather; Cancer (age of onset: 16) in her paternal grandmother. There is no history of Diabetes or Heart disease.  ROS:   Please see the history of present illness.    *** All other systems reviewed and are negative.  EKGs/Labs/Other Studies Reviewed:    The following studies were reviewed today: ***  EKG:  EKG is *** ordered today.  The ekg ordered today demonstrates ***  Recent Labs: 01/22/2022: TSH 0.99 09/11/2022: ALT 20; BUN 10; Creatinine, Ser 0.90; Hemoglobin 13.8; Platelets 348; Potassium 3.7; Sodium 141  Recent Lipid Panel    Component Value Date/Time   CHOL 117 01/22/2022 0000   TRIG 96 01/22/2022 0000   HDL 35 01/22/2022 0000   LDLCALC 64 01/22/2022 0000     Risk Assessment/Calculations:   {Does this patient have ATRIAL FIBRILLATION?[phone]  No BP recorded.  {Refresh Note OR Click here to enter BP  :1}***         Physical Exam:    VS:  There were no vitals taken for this visit.    Wt Readings from Last 3 Encounters:  09/16/22 210 lb (95.3 kg)  09/11/22 210 lb (95.3 kg)  04/17/22 207 lb 8 oz (94.1 kg)     GEN: *** Well nourished, well developed in no acute distress HEENT: Normal NECK: No JVD;  No carotid bruits LYMPHATICS: No lymphadenopathy CARDIAC: ***RRR, no murmurs, rubs, gallops RESPIRATORY:  Clear to auscultation without rales, wheezing or rhonchi  ABDOMEN: Soft, non-tender, non-distended MUSCULOSKELETAL:  No edema; No deformity  SKIN: Warm and dry NEUROLOGIC:  Alert and oriented x 3 PSYCHIATRIC:  Normal affect   ASSESSMENT:    No diagnosis found. PLAN:    In order of problems listed above:  #Tachycardia: -Check zio monitor      {Are you ordering a CV Procedure (e.g. stress test, cath, DCCV, TEE, etc)?   Press F2        :[id number]}    Medication Adjustments/Labs and Tests Ordered: Current medicines are reviewed at length with the patient today.  Concerns regarding medicines are outlined above.  No orders of the defined types were placed in this encounter.  No orders of the defined types were placed in this encounter.   There are no Patient Instructions on file for this visit.   Signed, Meriam Sprague, MD  10/21/2022 8:37 PM     [facility]

## 2022-10-23 ENCOUNTER — Ambulatory Visit (INDEPENDENT_AMBULATORY_CARE_PROVIDER_SITE_OTHER): Payer: Medicaid Other

## 2022-10-23 ENCOUNTER — Ambulatory Visit: Payer: Medicaid Other | Attending: Cardiology | Admitting: Cardiology

## 2022-10-23 ENCOUNTER — Telehealth: Payer: Self-pay | Admitting: *Deleted

## 2022-10-23 ENCOUNTER — Encounter: Payer: Self-pay | Admitting: Cardiology

## 2022-10-23 VITALS — BP 116/78 | HR 104 | Ht 63.0 in | Wt 207.8 lb

## 2022-10-23 DIAGNOSIS — R000 Tachycardia, unspecified: Secondary | ICD-10-CM | POA: Diagnosis not present

## 2022-10-23 NOTE — Progress Notes (Signed)
Cardiology Office Note:    Date:  10/23/2022   ID:  Ann Rice, DOB 05-16-1991, MRN [id number]  PCP:  Camillia Herter, NP   [facility] Providers Cardiologist:  None {  Referring MD: Camillia Herter, NP    History of Present Illness:    Ann Rice is a 31 y.o. female with a hx of anxiety and depression who was referred by Durene Fruits, NP for further evaluation of tachycardia.   Today, the patient states that she has received alerts from her smart watch regarding episodes of tachycardia >120 bpm. This occurs when she is just sitting. She denies feeling any palpitations or other associated symptoms at those times. Of note, she does complain of intermittent tremors, but this does not necessarily correlate with her tachycardic episodes. She confirms that caffeine and dehydration are possible triggers. Usually she has 1 cup of coffee in the weekday mornings.  Regarding her activity, she may become a little winded with extended walking which she considers to be normal. Additionally, she frequently feels exhausted, which she attributes to her depression.  Currently she complains of a headache. She denies any significant LE edema.  She denies any chest pain, lightheadedness, syncope, orthopnea, or PND.   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[place])    Depression    Ear itch    Headache    Loose stools    Migraine    Psoriasis     Past Surgical History:  Procedure Laterality Date   BLADDER SURGERY     CESAREAN SECTION N/A 08/20/2019   Procedure: CESAREAN SECTION;  Surgeon: Bobbye Charleston, MD;  Location: [facility];  Service: Obstetrics;  Laterality: N/A;   FOOT SURGERY Left 2008    great toe broken, pin placed and removed    TONSILLECTOMY     At age of 56   WISDOM TOOTH EXTRACTION      Current Medications: Current Meds  Medication Sig   Aspirin-Acetaminophen-Caffeine (EXCEDRIN EXTRA STRENGTH PO) Take 1  tablet by mouth as directed.   Cholecalciferol 125 MCG (5000 UT) TABS Vitamin D3 125 mcg (5,000 unit) tablet  Take 1 tablet every day by oral route.   cyclobenzaprine (FLEXERIL) 10 MG tablet Take 1 tablet (10 mg total) by mouth 3 (three) times daily as needed for muscle spasms.   DULoxetine (CYMBALTA) 30 MG capsule Take 30 mg by mouth daily.   ketoconazole (NIZORAL) 2 % cream Apply 1 Application topically daily as needed for irritation.   levonorgestrel (MIRENA) 20 MCG/DAY IUD 1 each by Intrauterine route once.   loratadine (CLARITIN) 10 MG tablet loratadine 10 mg tablet  TAKE 1 TABLET BY MOUTH ONCE DAILY   polyethylene glycol (MIRALAX / GLYCOLAX) 17 g packet Take 17 g by mouth daily.   terbinafine (LAMISIL) 250 MG tablet Take 1 tablet (250 mg total) by mouth daily.   zonisamide (ZONEGRAN) 100 MG capsule Take 1 capsule (100 mg total) by mouth daily.     Allergies:   Mirena [levonorgestrel]   Social History   Socioeconomic History   Marital status: Divorced    Spouse name: Not on file   Number of children: 0    Years of education: some colle   Highest education level: Not on file  Occupational History   Occupation: Unemployed     Employer: [employer]  Tobacco Use   Smoking status: Former    Types: Landscape architect  Passive exposure: Current   Smokeless tobacco: Current  Vaping Use   Vaping Use: Every day   Substances: Nicotine  Substance and Sexual Activity   Alcohol use: Yes    Comment: social   Drug use: No   Sexual activity: Yes    Birth control/protection: Injection  Other Topics Concern   Not on file  Social History Narrative   Lives with husband.   Has 2 dogs.    Right handed   2 story home   Caffeine yes      Social Determinants of Health   Financial Resource Strain: Low Risk  (08/06/2019)   Overall Financial Resource Strain (CARDIA)    Difficulty of Paying Living Expenses: Not hard at all  Food Insecurity: No Food Insecurity (08/06/2019)   Hunger  Vital Sign    Worried About Running Out of Food in the Last Year: Never true    [place] in the Last Year: Never true  Transportation Needs: No Transportation Needs (08/06/2019)   PRAPARE - Hydrologist (Medical): No    Lack of Transportation (Non-Medical): No  Physical Activity: Not on file  Stress: Not on file  Social Connections: Not on file     Family History: The patient's family history includes Anxiety disorder in her mother; Cancer in her maternal grandfather; Cancer (age of onset: 9) in her paternal grandmother. There is no history of Diabetes or Heart disease.  ROS:   Review of Systems  Constitutional:  Positive for malaise/fatigue. Negative for chills and fever.  HENT:  Negative for nosebleeds and tinnitus.   Eyes:  Negative for blurred vision and pain.  Respiratory:  Positive for shortness of breath. Negative for cough, hemoptysis and stridor.   Cardiovascular:  Negative for chest pain, palpitations, orthopnea, claudication, leg swelling and PND.  Gastrointestinal:  Negative for blood in stool, diarrhea, nausea and vomiting.  Genitourinary:  Negative for dysuria and hematuria.  Musculoskeletal:  Negative for falls.  Neurological:  Positive for tremors and headaches. Negative for dizziness and loss of consciousness.  Psychiatric/Behavioral:  Positive for depression. Negative for hallucinations and substance abuse. The patient does not have insomnia.      EKGs/Labs/Other Studies Reviewed:    The following studies were reviewed today:  No prior cardiovascular studies available.   EKG:  EKG is personally reviewed. 10/23/2022:  Sinus tachycardia. Rate 104 bpm.  Recent Labs: 01/22/2022: TSH 0.99 09/11/2022: ALT 20; BUN 10; Creatinine, Ser 0.90; Hemoglobin 13.8; Platelets 348; Potassium 3.7; Sodium 141   Recent Lipid Panel    Component Value Date/Time   CHOL 117 01/22/2022 0000   TRIG 96 01/22/2022 0000   HDL 35 01/22/2022 0000    LDLCALC 64 01/22/2022 0000     Risk Assessment/Calculations:                Physical Exam:    VS:  BP 116/78   Pulse (!) 104   Ht '5\' 3"'$  (1.6 m)   Wt 207 lb 12.8 oz (94.3 kg)   SpO2 97%   BMI 36.81 kg/m     Wt Readings from Last 3 Encounters:  10/23/22 207 lb 12.8 oz (94.3 kg)  09/16/22 210 lb (95.3 kg)  09/11/22 210 lb (95.3 kg)     GEN: Well nourished, well developed in no acute distress HEENT: Normal NECK: No JVD; No carotid bruits CARDIAC: RRR, 1/6 flow systolic murmur, No rubs, No gallops RESPIRATORY:  Clear to auscultation without rales, wheezing  or rhonchi  ABDOMEN: Soft, non-tender, non-distended MUSCULOSKELETAL:  No edema; No deformity  SKIN: Warm and dry NEUROLOGIC:  Alert and oriented x 3 PSYCHIATRIC:  Normal affect   ASSESSMENT:    1. Tachycardia    PLAN:    In order of problems listed above:  #Tachycardia: Patient with notifications from her apple watch of HR over 120 while sitting. No significant symptoms at that time. Suspect this may be related to dehydration but will check zio monitor for further evaluation. TSH is normal, Hgb normal. -Check zio monitor -Increase hydration -Cut back on caffeine -TSH normal, HgB normal      Follow-up:  PRN.   Medication Adjustments/Labs and Tests Ordered: Current medicines are reviewed at length with the patient today.  Concerns regarding medicines are outlined above.   Orders Placed This Encounter  Procedures   LONG TERM MONITOR (3-14 DAYS)   EKG 12-Lead   No orders of the defined types were placed in this encounter.  Patient Instructions  Medication Instructions:   Your physician recommends that you continue on your current medications as directed. Please refer to the Current Medication list given to you today.  *If you need a refill on your cardiac medications before your next appointment, please call your pharmacy*   Testing/Procedures:  [place] Monitor Instructions  Your  physician has requested you wear a ZIO patch monitor for 3 days.  This is a single patch monitor. [employer] supplies one patch monitor per enrollment. Additional stickers are not available. Please do not apply patch if you will be having a Nuclear Stress Test,  Echocardiogram, Cardiac CT, MRI, or Chest Xray during the period you would be wearing the  monitor. The patch cannot be worn during these tests. You cannot remove and re-apply the  ZIO XT patch monitor.  Your ZIO patch monitor will be mailed 3 day USPS to your address on file. It may take 3-5 days  to receive your monitor after you have been enrolled.  Once you have received your monitor, please review the enclosed instructions. Your monitor  has already been registered assigning a specific monitor serial # to you.  Billing and Patient Assistance Program Information  We have supplied [employer] with any of your insurance information on file for billing purposes. [employer] offers a sliding scale Patient Assistance Program for patients that do not have  insurance, or whose insurance does not completely cover the cost of the ZIO monitor.  You must apply for the Patient Assistance Program to qualify for this discounted rate.  To apply, please call [employer] at [phone], select option 4, select option 2, ask to apply for  Patient Assistance Program. Theodore Demark will ask your household income, and how many people  are in your household. They will quote your out-of-pocket cost based on that information.  [employer] will also be able to set up a 61-month interest-free payment plan if needed.  Applying the monitor   Shave hair from upper left chest.  Hold abrader disc by orange tab. Rub abrader in 40 strokes over the upper left chest as  indicated in your monitor instructions.  Clean area with 4 enclosed alcohol pads. Let dry.  Apply patch as indicated in monitor instructions. Patch will be placed under collarbone on left  side of chest with arrow  pointing upward.  Rub patch adhesive wings for 2 minutes. Remove white label marked "1". Remove the white  label marked "2". Rub patch adhesive wings for 2 additional minutes.  While looking in a mirror, press and release button in center of patch. A small green light will  flash 3-4 times. This will be your only indicator that the monitor has been turned on.  Do not shower for the first 24 hours. You may shower after the first 24 hours.  Press the button if you feel a symptom. You will hear a small click. Record Date, Time and  Symptom in the Patient Logbook.  When you are ready to remove the patch, follow instructions on the last 2 pages of Patient  Logbook. Stick patch monitor onto the last page of Patient Logbook.  Place Patient Logbook in the blue and white box. Use locking tab on box and tape box closed  securely. The blue and white box has prepaid postage on it. Please place it in the mailbox as  soon as possible. Your physician should have your test results approximately 7 days after the  monitor has been mailed back to [facility].  Call [place] at [phone] if you have questions regarding  your ZIO XT patch monitor. Call them immediately if you see an orange light blinking on your  monitor.  If your monitor falls off in less than 4 days, contact our Monitor department at [phone].  If your monitor becomes loose or falls off after 4 days call [employer] at [phone] for  suggestions on securing your monitor    Follow-Up:   AS NEEDED WITH DR. Johney Frame    Important Information About Sugar         I,Mathew Stumpf,acting as a scribe for Freada Bergeron, MD.,have documented all relevant documentation on the behalf of Freada Bergeron, MD,as directed by  Freada Bergeron, MD while in the presence of Freada Bergeron, MD.  I, Freada Bergeron, MD, have reviewed all documentation for this visit. The documentation on 10/23/22  for the exam, diagnosis, procedures, and orders are all accurate and complete.   Signed, Freada Bergeron, MD  10/23/2022 3:20 PM    [place]

## 2022-10-23 NOTE — Telephone Encounter (Signed)
-----   Message from Jennefer Bravo sent at 10/23/2022  3:31 PM EST ----- Regarding: RE: 3 DAY ZIO PER DR. Johney Frame Done ----- Message ----- From: Nuala Alpha, LPN Sent: 42/08/378   3:17 PM EST To: Nuala Alpha, LPN; Shelly A Wells; # Subject: 3 DAY ZIO PER DR. Johney Frame                    3 day zio ordered for tachycardia  Please enroll  Thanks Ann Rice

## 2022-10-23 NOTE — Progress Notes (Unsigned)
Enrolled for [employer] to mail a ZIO XT long term holter monitor to the patients address on file.  

## 2022-10-23 NOTE — Patient Instructions (Signed)
Medication Instructions:   Your physician recommends that you continue on your current medications as directed. Please refer to the Current Medication list given to you today.  *If you need a refill on your cardiac medications before your next appointment, please call your pharmacy*   Testing/Procedures:  [place] Monitor Instructions  Your physician has requested you wear a ZIO patch monitor for 3 days.  This is a single patch monitor. [employer] supplies one patch monitor per enrollment. Additional stickers are not available. Please do not apply patch if you will be having a Nuclear Stress Test,  Echocardiogram, Cardiac CT, MRI, or Chest Xray during the period you would be wearing the  monitor. The patch cannot be worn during these tests. You cannot remove and re-apply the  ZIO XT patch monitor.  Your ZIO patch monitor will be mailed 3 day USPS to your address on file. It may take 3-5 days  to receive your monitor after you have been enrolled.  Once you have received your monitor, please review the enclosed instructions. Your monitor  has already been registered assigning a specific monitor serial # to you.  Billing and Patient Assistance Program Information  We have supplied [employer] with any of your insurance information on file for billing purposes. [employer] offers a sliding scale Patient Assistance Program for patients that do not have  insurance, or whose insurance does not completely cover the cost of the ZIO monitor.  You must apply for the Patient Assistance Program to qualify for this discounted rate.  To apply, please call [employer] at [phone], select option 4, select option 2, ask to apply for  Patient Assistance Program. Theodore Demark will ask your household income, and how many people  are in your household. They will quote your out-of-pocket cost based on that information.  [employer] will also be able to set up a 33-month interest-free payment plan if  needed.  Applying the monitor   Shave hair from upper left chest.  Hold abrader disc by orange tab. Rub abrader in 40 strokes over the upper left chest as  indicated in your monitor instructions.  Clean area with 4 enclosed alcohol pads. Let dry.  Apply patch as indicated in monitor instructions. Patch will be placed under collarbone on left  side of chest with arrow pointing upward.  Rub patch adhesive wings for 2 minutes. Remove white label marked "1". Remove the white  label marked "2". Rub patch adhesive wings for 2 additional minutes.  While looking in a mirror, press and release button in center of patch. A small green light will  flash 3-4 times. This will be your only indicator that the monitor has been turned on.  Do not shower for the first 24 hours. You may shower after the first 24 hours.  Press the button if you feel a symptom. You will hear a small click. Record Date, Time and  Symptom in the Patient Logbook.  When you are ready to remove the patch, follow instructions on the last 2 pages of Patient  Logbook. Stick patch monitor onto the last page of Patient Logbook.  Place Patient Logbook in the blue and white box. Use locking tab on box and tape box closed  securely. The blue and white box has prepaid postage on it. Please place it in the mailbox as  soon as possible. Your physician should have your test results approximately 7 days after the  monitor has been [facility]  Call [employer]  Customer Care at [phone] if you have questions regarding  your ZIO XT patch monitor. Call them immediately if you see an orange light blinking on your  monitor.  If your monitor falls off in less than 4 days, contact our Monitor department at [phone].  If your monitor becomes loose or falls off after 4 days call [employer] at [phone] for  suggestions on securing your monitor    Follow-Up:   AS NEEDED WITH DR. Johney Frame    Important  Information About Sugar

## 2022-10-24 ENCOUNTER — Ambulatory Visit: Payer: Medicaid Other | Admitting: Certified Registered"

## 2022-10-24 ENCOUNTER — Ambulatory Visit
Admission: RE | Admit: 2022-10-24 | Discharge: 2022-10-24 | Disposition: A | Discharge status: Home / Self Care | Payer: Medicaid Other | Source: Ambulatory Visit | Attending: Gastroenterology | Admitting: Gastroenterology

## 2022-10-24 ENCOUNTER — Encounter
Admission: RE | Disposition: A | Discharge status: Home / Self Care | Payer: Self-pay | Source: Ambulatory Visit | Attending: Gastroenterology

## 2022-10-24 ENCOUNTER — Encounter: Payer: Self-pay | Admitting: Gastroenterology

## 2022-10-24 ENCOUNTER — Other Ambulatory Visit: Payer: Self-pay

## 2022-10-24 DIAGNOSIS — K6389 Other specified diseases of intestine: Secondary | ICD-10-CM | POA: Diagnosis not present

## 2022-10-24 DIAGNOSIS — F32A Depression, unspecified: Secondary | ICD-10-CM | POA: Diagnosis not present

## 2022-10-24 DIAGNOSIS — R1031 Right lower quadrant pain: Secondary | ICD-10-CM

## 2022-10-24 DIAGNOSIS — Z87891 Personal history of nicotine dependence: Secondary | ICD-10-CM | POA: Diagnosis not present

## 2022-10-24 DIAGNOSIS — R197 Diarrhea, unspecified: Secondary | ICD-10-CM | POA: Insufficient documentation

## 2022-10-24 DIAGNOSIS — F419 Anxiety disorder, unspecified: Secondary | ICD-10-CM | POA: Insufficient documentation

## 2022-10-24 HISTORY — PX: COLONOSCOPY WITH PROPOFOL: SHX5780

## 2022-10-24 LAB — POCT PREGNANCY, URINE: Preg Test, Ur: NEGATIVE

## 2022-10-24 SURGERY — COLONOSCOPY WITH PROPOFOL
Anesthesia: General

## 2022-10-24 MED ORDER — PROPOFOL 10 MG/ML IV BOLUS
INTRAVENOUS | Status: AC
  Filled 2022-10-24: qty 20

## 2022-10-24 MED ORDER — LIDOCAINE HCL (CARDIAC) PF 100 MG/5ML IV SOSY
PREFILLED_SYRINGE | INTRAVENOUS | Status: DC | PRN
  Administered 2022-10-24: 100 mg via INTRAVENOUS

## 2022-10-24 MED ORDER — PROPOFOL 10 MG/ML IV BOLUS
INTRAVENOUS | Status: DC | PRN
  Administered 2022-10-24: 130 ug/kg/min via INTRAVENOUS
  Administered 2022-10-24: 150 mg via INTRAVENOUS

## 2022-10-24 MED ORDER — LIDOCAINE HCL (PF) 2 % IJ SOLN
INTRAMUSCULAR | Status: AC
  Filled 2022-10-24: qty 10

## 2022-10-24 MED ORDER — SODIUM CHLORIDE 0.9 % IV SOLN: INTRAVENOUS | Status: DC

## 2022-10-24 NOTE — Anesthesia Preprocedure Evaluation (Signed)
Anesthesia Evaluation  Patient identified by MRN, date of birth, ID band Patient awake    Reviewed: Allergy & Precautions, NPO status , Patient's Chart, lab work & pertinent test results  History of Anesthesia Complications Negative for: history of anesthetic complications  Airway Mallampati: II  TM Distance: >3 FB Neck ROM: Full    Dental no notable dental hx. (+) Teeth Intact   Pulmonary neg pulmonary ROS, neg sleep apnea, neg COPD, Patient abstained from smoking.Not current smoker, former smoker   Pulmonary exam normal breath sounds clear to auscultation       Cardiovascular Exercise Tolerance: Good METS(-) hypertension(-) CAD and (-) Past MI negative cardio ROS (-) dysrhythmias  Rhythm:Regular Rate:Normal - Systolic murmurs    Neuro/Psych  Headaches PSYCHIATRIC DISORDERS Anxiety Depression       GI/Hepatic ,neg GERD  ,,(+)     (-) substance abuse    Endo/Other  neg diabetes    Renal/GU negative Renal ROS     Musculoskeletal   Abdominal  (+) + obese  Peds  Hematology   Anesthesia Other Findings Past Medical History: No date: Anxiety No date: 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[place]) No date: Depression No date: Ear itch No date: Headache No date: Loose stools No date: Migraine No date: Psoriasis  Reproductive/Obstetrics                             Anesthesia Physical Anesthesia Plan  ASA: 2  Anesthesia Plan: General   Post-op Pain Management: Minimal or no pain anticipated   Induction: Intravenous  PONV Risk Score and Plan: 3 and Propofol infusion, TIVA and Ondansetron  Airway Management Planned: Nasal Cannula  Additional Equipment: None  Intra-op Plan:   Post-operative Plan:   Informed Consent: I have reviewed the patients History and Physical, chart, labs and discussed the procedure including the risks, benefits and alternatives for the proposed anesthesia with the  patient or authorized representative who has indicated his/her understanding and acceptance.     Dental advisory given  Plan Discussed with: CRNA and Surgeon  Anesthesia Plan Comments: (Discussed risks of anesthesia with patient, including possibility of difficulty with spontaneous ventilation under anesthesia necessitating airway intervention, PONV, and rare risks such as cardiac or respiratory or neurological events, and allergic reactions. Discussed the role of CRNA in patient's perioperative care. Patient understands.  Pending urine pregnancy test)       Anesthesia Quick Evaluation

## 2022-10-24 NOTE — Anesthesia Postprocedure Evaluation (Signed)
Anesthesia Post Note  Patient: Ann Rice  Procedure(s) Performed: COLONOSCOPY WITH PROPOFOL  Patient location during evaluation: Endoscopy Anesthesia Type: General Level of consciousness: awake and alert Pain management: pain level controlled Vital Signs Assessment: post-procedure vital signs reviewed and stable Respiratory status: spontaneous breathing, nonlabored ventilation, respiratory function stable and patient connected to nasal cannula oxygen Cardiovascular status: blood pressure returned to baseline and stable Postop Assessment: no apparent nausea or vomiting Anesthetic complications: no   No notable events documented.   Last Vitals:  Vitals:   10/24/22 1315 10/24/22 1325  BP: 105/80 111/83  Pulse: (!) 107 88  Resp: 17 14  Temp:    SpO2: 96% 100%    Last Pain:  Vitals:   10/24/22 1325  TempSrc:   PainSc: 0-No pain                 Arita Miss

## 2022-10-24 NOTE — H&P (Signed)
Ann Darby, MD [address]  [place]  [place], [place] [address]  Main: [phone]  Fax: [phone] Pager: [phone]  Primary Care Physician:  Camillia Herter, NP Primary Gastroenterologist:  Dr. Cephas Rice  Pre-Procedure History & Physical: HPI:  Ann Rice is a 31 y.o. female is here for an colonoscopy.   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 Authorizing Provider  Aspirin-Acetaminophen-Caffeine (EXCEDRIN EXTRA STRENGTH PO) Take 1 tablet by mouth as directed.   Yes [provider]  Cholecalciferol 125 MCG (5000 UT) TABS Vitamin D3 125 mcg (5,000 unit) tablet  Take 1 tablet every day by oral route.   Yes [provider]  cyclobenzaprine (FLEXERIL) 10 MG tablet Take 1 tablet (10 mg total) by mouth 3 (three) times daily as needed for muscle spasms. 04/03/22  Yes Jaffe, Adam R, DO  DULoxetine (CYMBALTA) 30 MG capsule Take 30 mg by mouth daily.   Yes [provider]  ketoconazole (NIZORAL) 2 % cream Apply 1 Application topically daily as needed for irritation.   Yes [provider]  levonorgestrel (MIRENA) 20 MCG/DAY IUD 1 each by Intrauterine route once.   Yes [provider]  loratadine (CLARITIN) 10 MG tablet loratadine 10 mg tablet  TAKE 1 TABLET BY MOUTH ONCE DAILY   Yes [provider]  polyethylene glycol (MIRALAX /  GLYCOLAX) 17 g packet Take 17 g by mouth daily. 09/11/22  Yes Tegeler, Gwenyth Allegra, MD  terbinafine (LAMISIL) 250 MG tablet Take 1 tablet (250 mg total) by mouth daily. 09/27/22 12/26/22 Yes McDonald, Stephan Minister, DPM  zonisamide (ZONEGRAN) 100 MG capsule Take 1 capsule (100 mg total) by mouth daily. 06/03/22  Yes Pieter Partridge, DO    Allergies as of 09/17/2022 - Review Complete 09/16/2022  Allergen Reaction Noted   Mirena [levonorgestrel]  09/16/2022    Family History  Problem Relation Age of Onset   Anxiety disorder Mother    Cancer Maternal Grandfather        lung    Cancer Paternal Grandmother 71       lung cancer    Diabetes Neg Hx    Heart disease Neg Hx     Social History   Socioeconomic History   Marital status: Divorced    Spouse name: Not on file   Number of children: 0    Years of education: some colle   Highest education level: Not on file  Occupational History   Occupation: Unemployed     Employer: [employer]  Tobacco Use   Smoking status: Former    Types: Cigars    Passive exposure: Current   Smokeless tobacco: Current  Vaping Use   Vaping Use: Every day   Substances:  Nicotine  Substance and Sexual Activity   Alcohol use: Yes    Comment: social   Drug use: No   Sexual activity: Yes    Birth control/protection: Injection  Other Topics Concern   Not on file  Social History Narrative   Lives with husband.   Has 2 dogs.    Right handed   2 story home   Caffeine yes      Social Determinants of Health   Financial Resource Strain: Low Risk  (08/06/2019)   Overall Financial Resource Strain (CARDIA)    Difficulty of Paying Living Expenses: Not hard at all  Food Insecurity: No Food Insecurity (08/06/2019)   Hunger Vital Sign    Worried About Running Out of Food in the Last Year: Never true    [place] in the Last Year: Never true  Transportation Needs: No Transportation Needs (08/06/2019)   PRAPARE - Radiographer, therapeutic (Medical): No    Lack of Transportation (Non-Medical): No  Physical Activity: Not on file  Stress: Not on file  Social Connections: Not on file  Intimate Partner Violence: Not on file    Review of Systems: See HPI, otherwise negative ROS  Physical Exam: BP (!) 140/106   Temp (!) 97.5 F (36.4 C) (Temporal)   Resp 16   Ht '5\' 3"'$  (1.6 m)   Wt 94.3 kg   SpO2 99%   BMI 36.85 kg/m  General:   Alert,  pleasant and cooperative in NAD Head:  Normocephalic and atraumatic. Neck:  Supple; no masses or thyromegaly. Lungs:  Clear throughout to auscultation.    Heart:  Regular rate and rhythm. Abdomen:  Soft, nontender and nondistended. Normal bowel sounds, without guarding, and without rebound.   Neurologic:  Alert and  oriented x4;  grossly normal neurologically.  Impression/Plan: Ann Rice is here for an colonoscopy to be performed for chronic intermittent RLQ pain, loose stools  Risks, benefits, limitations, and alternatives regarding  colonoscopy have been reviewed with the patient.  Questions have been answered.  All parties agreeable.   Sherri Sear, MD  10/24/2022, 11:50 AM

## 2022-10-24 NOTE — Transfer of Care (Signed)
Immediate Anesthesia Transfer of Care Note  Patient: Ann Rice  Procedure(s) Performed: COLONOSCOPY WITH PROPOFOL  Patient Location: PACU  Anesthesia Type:General  Level of Consciousness: drowsy  Airway & Oxygen Therapy: Patient Spontanous Breathing  Post-op Assessment: Report given to RN and Post -op Vital signs reviewed and stable  Post vital signs: Reviewed and stable  Last Vitals:  Vitals Value Taken Time  BP 103/73 10/24/22 1305  Temp 35.7 1305  Pulse 95 10/24/22 1305  Resp 16 10/24/22 1305  SpO2 100 % 10/24/22 1305  Vitals shown include unvalidated device data.  Last Pain:  Vitals:   10/24/22 1143  TempSrc: Temporal  PainSc: 0-No pain         Complications: No notable events documented.

## 2022-10-24 NOTE — Op Note (Signed)
[facility]astroenter[facility] Patient Name: Ann Rice Procedure Date: 10/24/2022 12:23 PM MRN: [id number] Account #: [id number] Date of Birth: [birth year] Admit Type: Outpatient Age: 31 Room: [facility] ENDO ROOM 3 Gender: Female Note Status: Finalized Instrument Name: Colonoscope [id number] Procedure:             Colonoscopy Indications:           This is the patient's first colonoscopy, Abdominal                         pain in the right lower quadrant, Clinically                         significant diarrhea of unexplained origin Providers:             Lin Landsman MD, MD Referring MD:          Camillia Herter (Referring MD) Medicines:             General Anesthesia Complications:         No immediate complications. Estimated blood loss: None. Procedure:             Pre-Anesthesia Assessment:                        - Prior to the procedure, a History and Physical was                         performed, and patient medications and allergies were                         reviewed. The patient is competent. The risks and                         benefits of the procedure and the sedation options and                         risks were discussed with the patient. All questions                         were answered and informed consent was obtained.                         Patient identification and proposed procedure were                         verified by the physician, the nurse, the                         anesthesiologist, the anesthetist and the technician                         in the pre-procedure area in the procedure room in the                         endoscopy suite. Mental Status Examination: alert and                         oriented. Airway Examination: normal oropharyngeal  airway and neck mobility. Respiratory Examination:                         clear to auscultation. CV Examination: normal.                         Prophylactic  Antibiotics: The patient does not require                         prophylactic antibiotics. Prior Anticoagulants: The                         patient has taken no anticoagulant or antiplatelet                         agents. ASA Grade Assessment: II - A patient with mild                         systemic disease. After reviewing the risks and                         benefits, the patient was deemed in satisfactory                         condition to undergo the procedure. The anesthesia                         plan was to use general anesthesia. Immediately prior                         to administration of medications, the patient was                         re-assessed for adequacy to receive sedatives. The                         heart rate, respiratory rate, oxygen saturations,                         blood pressure, adequacy of pulmonary ventilation, and                         response to care were monitored throughout the                         procedure. The physical status of the patient was                         re-assessed after the procedure.                        After obtaining informed consent, the colonoscope was                         passed under direct vision. Throughout the procedure,                         the patient's blood pressure, pulse, and oxygen  saturations were monitored continuously. The                         Colonoscope was introduced through the anus and                         advanced to the the terminal ileum, with                         identification of the appendiceal orifice and IC                         valve. The colonoscopy was performed without                         difficulty. The patient tolerated the procedure well.                         The quality of the bowel preparation was adequate. The                         terminal ileum, ileocecal valve, appendiceal orifice,                         and rectum were  photographed. Findings:      The perianal and digital rectal examinations were normal. Pertinent       negatives include normal sphincter tone and no palpable rectal lesions.      A patchy area of mucosa in the terminal ileum was mildly erythematous.       Biopsies were taken with a cold forceps for histology. Estimated blood       loss: none.      Normal mucosa was found in the entire colon.      The retroflexed view of the distal rectum and anal verge was normal and       showed no anal or rectal abnormalities. Impression:            - Erythematous mucosa in the terminal ileum. Biopsied.                        - Normal mucosa in the entire examined colon.                        - The distal rectum and anal verge are normal on                         retroflexion view. Recommendation:        - Discharge patient to home (with escort).                        - Resume previous diet today.                        - Continue present medications.                        - Await pathology results.                        - Return to  my office as previously scheduled. Procedure Code(s):     --- Professional ---                        [phone], Colonoscopy, flexible; with biopsy, single or                         multiple Diagnosis Code(s):     --- Professional ---                        K63.89, Other specified diseases of intestine                        R10.31, Right lower quadrant pain                        R19.7, Diarrhea, unspecified CPT copyright 2022 American Medical Association. All rights reserved. The codes documented in this report are preliminary and upon coder review may  be revised to meet current compliance requirements. Dr. Ulyess Mort Lin Landsman MD, MD 10/24/2022 1:03:24 PM This report has been signed electronically. Number of Addenda: 0 Note Initiated On: 10/24/2022 12:23 PM Scope Withdrawal Time: 0 hours 7 minutes 51 seconds  Total Procedure Duration: 0 hours 13  minutes 12 seconds  Estimated Blood Loss:  Estimated blood loss: none.      [facility]

## 2022-10-25 ENCOUNTER — Encounter: Payer: Self-pay | Admitting: Gastroenterology

## 2022-10-25 DIAGNOSIS — R000 Tachycardia, unspecified: Secondary | ICD-10-CM | POA: Diagnosis not present

## 2022-10-25 LAB — SURGICAL PATHOLOGY

## 2022-11-05 ENCOUNTER — Ambulatory Visit (INDEPENDENT_AMBULATORY_CARE_PROVIDER_SITE_OTHER): Payer: Medicaid Other | Admitting: Podiatry

## 2022-11-05 DIAGNOSIS — Z91199 Patient's noncompliance with other medical treatment and regimen due to unspecified reason: Secondary | ICD-10-CM

## 2022-11-05 NOTE — Progress Notes (Signed)
Patient was no-show for appointment today 

## 2022-11-15 ENCOUNTER — Telehealth: Payer: Self-pay | Admitting: Family

## 2022-11-15 NOTE — Telephone Encounter (Signed)
Called pt to make an appt w/ PCP, no answer and I left a voice msg to call back PCE [phone] or seek Urgent Care until 8pm and open on Saturday as well. (Following up on previous msg):   Appointment Request From: [name]   With Provider: Camillia Herter, NP [facility] [place]   Preferred Date Range: Any date 11/14/2022 or later   Preferred Times: Any Time   Reason for visit: Office Visit   Comments: Nose and throat concerns

## 2022-12-10 ENCOUNTER — Ambulatory Visit: Payer: Medicaid Other | Admitting: Podiatry

## 2022-12-10 DIAGNOSIS — B351 Tinea unguium: Secondary | ICD-10-CM | POA: Diagnosis not present

## 2022-12-11 LAB — HEPATIC FUNCTION PANEL
ALT: 40 IU/L — ABNORMAL HIGH (ref 0–32)
AST: 24 IU/L (ref 0–40)
Albumin: 4.7 g/dL (ref 4.0–5.0)
Alkaline Phosphatase: 93 IU/L (ref 44–121)
Bilirubin Total: 0.4 mg/dL (ref 0.0–1.2)
Bilirubin, Direct: 0.14 mg/dL (ref 0.00–0.40)
Total Protein: 7.3 g/dL (ref 6.0–8.5)

## 2022-12-12 ENCOUNTER — Encounter: Payer: Self-pay | Admitting: Podiatry

## 2022-12-12 NOTE — Progress Notes (Signed)
  Subjective:  Patient ID: Ann Rice, female    DOB: November 04, 1991,  MRN: [id number]  Chief Complaint  Patient presents with   Nail Problem    Thick painful toenails, 3 month follow up - doing better, concerned about her liver function    31 y.o. female presents with the above complaint. History confirmed with patient.  She notes some improvement once more still some grout on the right side but doing much better  Objective:  Physical Exam: warm, good capillary refill, no trophic changes or ulcerative lesions, normal DP and PT pulses, normal sensory exam, and much improved onychomycosis some residual growing out on the right side         Assessment:   1. Onychomycosis       Plan:  Patient was evaluated and treated and all questions answered.  Doing much better expect this will resolve uneventfully with time now I do not think she needs any further antifungal therapy.  We did check her liver function test which showed very slight elevations in ALT at 40 IU/L, I recommend we recheck this in 6 weeks to see how it is resolved.  She will follow-up me as needed at this point.   Return if symptoms worsen or fail to improve.

## 2022-12-16 ENCOUNTER — Encounter: Payer: Self-pay | Admitting: Neurology

## 2022-12-17 ENCOUNTER — Other Ambulatory Visit: Payer: Self-pay

## 2022-12-17 MED ORDER — ZONISAMIDE 100 MG PO CAPS: 100.0000 mg | ORAL_CAPSULE | Freq: Every day | ORAL | 5 refills | Status: DC

## 2022-12-17 MED ORDER — CYCLOBENZAPRINE HCL 10 MG PO TABS: 10.0000 mg | ORAL_TABLET | Freq: Three times a day (TID) | ORAL | 5 refills | Status: DC | PRN

## 2023-01-29 ENCOUNTER — Encounter: Payer: Self-pay | Admitting: Podiatry

## 2023-01-29 DIAGNOSIS — B351 Tinea unguium: Secondary | ICD-10-CM

## 2023-01-30 NOTE — Telephone Encounter (Signed)
Order has been faxed over 

## 2023-02-01 LAB — HEPATIC FUNCTION PANEL
ALT: 21 IU/L (ref 0–32)
AST: 21 IU/L (ref 0–40)
Albumin: 4.7 g/dL (ref 4.0–5.0)
Alkaline Phosphatase: 90 IU/L (ref 44–121)
Bilirubin Total: 0.3 mg/dL (ref 0.0–1.2)
Bilirubin, Direct: 0.11 mg/dL (ref 0.00–0.40)
Total Protein: 7.6 g/dL (ref 6.0–8.5)

## 2023-02-18 NOTE — Progress Notes (Unsigned)
NEUROLOGY FOLLOW UP OFFICE NOTE  Ann Rice ZN:[id number]  Assessment/Plan:   1  Tension-type headache, not intractable 2   Migraine without aura, without status migrainosus, not intractable   Increase zonisamide to '200mg'$  daily; cyclobenzaprine '20mg'$  at bedtime Next time she has a migraine, will have her try sample of Nurtec Limit use of pain relievers to no more than 2 days out of week to prevent risk of rebound or medication-overuse headache. Keep headache diary 6 months.     Subjective:  Ann Rice is a 32 year old right-handed female with psoriasis who follows up for headaches   UPDATE: Referred to physical therapy for neck pain.  She went to the first appointment but was not compliant due to lack of time.   Migraines:  Since last visit, had one migraine requiring a migraine cocktail.   Tension type: they occur 2 a week.  Still has stiffness in neck.  Uses heating pad.  Thinks the headaches are related to stress, not the neck.  Sleep is overall okay.     She notices noise in her right ear.  It is a fuzziness or ringing in the right ear.  Sound is a little muffled in the right ear.  Not constant.  Mainly noticeable at night.  She saw ENT however, she only discussed trouble swallowing, which was attributed to being related to GERD.      Frequency of abortive medication: Excedrin once a week.   Current NSAIDS/analgesics: Excedrin (once or twice since last visit) Current triptans:  none Current ergotamine:  none Current anti-emetic:  none Current muscle relaxants:  Flexeril '10mg'$  during day and '20mg'$  at night. Current Antihypertensive medications:  none Current Antidepressant medications:  duloxetine '30mg'$  daily Current Anticonvulsant medications:  none Current anti-CGRP:  none Current Vitamins/Herbal/Supplements:  Ferrous sulfate Current Antihistamines/Decongestants:   Other therapy:  heating pad to the neck Hormone/birth control:  medroxyprogesterone Other  medications:  Latuda   Drinks 1 iced coffee in morning and sweet tea throughout the day.  Does not drink water.  Sometimes skips meals.  She is able to fall asleep but wakes up once or twice in the night but able to quickly fall asleep.  She used to be able to sleep through the night.       HISTORY:  She had had headaches as a child which eventually resolved.  She started having headaches again in late [facility] December 2021.  She describes a severe pulsating pain in the left parietal region with nausea, photophobia, phonophobia, blurred vision and sometimes vomiting.  No numbness or weakness.  They would last 10-12 hours and occur 3 to 4 times a week.  One time, she had an intractable headache lasting 2 1/2 to 3 days, requiring a Toradol shot.  She was unable to drive home from work.  Triggers include stress, caffeine withdrawal and possibly vaping.  Sleep helped relieve it.  She thinks it work-related stress was the primary trigger.  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  She left her job and headaches pretty much resolved.  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     cyclobenzaprine (FLEXERIL) 10 MG tablet Take 1 tablet (10 mg total) by mouth 3 (three) times daily as needed for muscle spasms. 90 tablet 5   DULoxetine (CYMBALTA) 30 MG capsule Take 30 mg by mouth daily.     ketoconazole (NIZORAL) 2 % cream Apply 1 Application topically daily as needed for irritation.     levonorgestrel (MIRENA) 20 MCG/DAY IUD 1 each by Intrauterine route once.     loratadine (CLARITIN) 10 MG tablet loratadine 10 mg tablet  TAKE 1 TABLET BY MOUTH ONCE DAILY     polyethylene glycol (MIRALAX / GLYCOLAX) 17 g packet Take 17 g by mouth daily. 14 each 0   zonisamide (ZONEGRAN) 100 MG capsule Take 1 capsule (100 mg total) by mouth daily. 30 capsule 5   No current facility-administered medications on file prior to visit.    ALLERGIES: Allergies  Allergen Reactions   Mirena [Levonorgestrel]     FAMILY HISTORY: Family History  Problem Relation Age of Onset   Anxiety disorder Mother    Cancer Maternal Grandfather        lung    Cancer Paternal Grandmother 12       lung cancer    Diabetes Neg Hx    Heart disease Neg Hx       Objective:  Blood pressure 110/69, pulse (!) 106, height '5\' 3"'$  (1.6 m), weight 210 lb 9.6 oz (95.5 kg), SpO2 98 %. General: No acute distress.  Patient appears well-groomed.   Head:  Normocephalic/atraumatic Eyes:  Fundi examined but not visualized Neck: supple, no paraspinal tenderness, full range of motion Heart:  Regular rate and rhythm Lungs:  Clear to auscultation bilaterally Back: No paraspinal tenderness Neurological Exam: alert and oriented to person, place, and time.  Speech fluent and not dysarthric, language intact.  CN II-XII intact. Bulk and tone normal, muscle strength 5/5 throughout.  Sensation to light  touch intact.  Deep tendon reflexes 2+ throughout, toes downgoing.  Finger to nose testing intact.  Gait normal, Romberg negative.   Metta Clines, DO  CC: Durene Fruits, NP

## 2023-02-19 ENCOUNTER — Encounter: Payer: Self-pay | Admitting: Neurology

## 2023-02-19 ENCOUNTER — Ambulatory Visit: Payer: Medicaid Other | Admitting: Neurology

## 2023-02-19 VITALS — BP 110/69 | HR 106 | Ht 63.0 in | Wt 210.6 lb

## 2023-02-19 DIAGNOSIS — G43009 Migraine without aura, not intractable, without status migrainosus: Secondary | ICD-10-CM | POA: Diagnosis not present

## 2023-02-19 DIAGNOSIS — G44219 Episodic tension-type headache, not intractable: Secondary | ICD-10-CM | POA: Diagnosis not present

## 2023-02-19 MED ORDER — ZONISAMIDE 100 MG PO CAPS: 200.0000 mg | ORAL_CAPSULE | Freq: Every day | ORAL | 5 refills | Status: DC

## 2023-02-19 MED ORDER — NURTEC 75 MG PO TBDP: ORAL_TABLET | ORAL | 0 refills | Status: DC

## 2023-02-19 NOTE — Patient Instructions (Signed)
Increase zonidamide to '200mg'$  daily Continue cyclobenzaprine '20mg'$  at bedtime Next time you have a migraine, try Nurtec (1 tablet daily as needed).  Let me know how you like it

## 2023-03-16 ENCOUNTER — Encounter: Payer: Self-pay | Admitting: Neurology

## 2023-03-17 ENCOUNTER — Other Ambulatory Visit: Payer: Self-pay | Admitting: Neurology

## 2023-03-17 MED ORDER — NURTEC 75 MG PO TBDP: 75.0000 mg | ORAL_TABLET | Freq: Every day | ORAL | 11 refills | Status: DC | PRN

## 2023-03-18 ENCOUNTER — Telehealth: Payer: Self-pay

## 2023-03-18 NOTE — Telephone Encounter (Signed)
Per Patient she needs a PA for Nurtec 75 mg.  PA team please start a PA for Nurtec.

## 2023-03-21 ENCOUNTER — Other Ambulatory Visit: Payer: Self-pay | Admitting: Neurology

## 2023-03-21 ENCOUNTER — Other Ambulatory Visit (HOSPITAL_COMMUNITY): Payer: Self-pay

## 2023-03-21 ENCOUNTER — Telehealth: Payer: Self-pay | Admitting: Pharmacy Technician

## 2023-03-21 DIAGNOSIS — G43009 Migraine without aura, not intractable, without status migrainosus: Secondary | ICD-10-CM

## 2023-03-21 MED ORDER — NURTEC 75 MG PO TBDP: 75.0000 mg | ORAL_TABLET | Freq: Every day | ORAL | 11 refills | Status: DC | PRN

## 2023-03-21 NOTE — Telephone Encounter (Signed)
Received notification from [employer] regarding a prior authorization for NURTEC 75MG . Authorization has been APPROVED from 4.5.24 to 4.5.25.   Per test claim, copay for 8 days supply is [employer] will also pay for higher quantity (#32) for same copay amount of $4 per test billing.   Authorization # [id number]

## 2023-03-21 NOTE — Telephone Encounter (Signed)
Patient Advocate Encounter  Received notification from [employer] that prior authorization for NURTEC 75MG  is required.   PA submitted on 4.5.24 Key [id number] Status is pending

## 2023-03-21 NOTE — Telephone Encounter (Signed)
Patient advised by mychart and VM.   Are we able to change the script to the #32 tabs

## 2023-06-09 ENCOUNTER — Encounter: Payer: Self-pay | Admitting: Neurology

## 2023-08-10 ENCOUNTER — Encounter: Payer: Self-pay | Admitting: Neurology

## 2023-08-11 ENCOUNTER — Other Ambulatory Visit: Payer: Self-pay | Admitting: Neurology

## 2023-08-11 DIAGNOSIS — G43009 Migraine without aura, not intractable, without status migrainosus: Secondary | ICD-10-CM

## 2023-08-11 MED ORDER — TIZANIDINE HCL 4 MG PO TABS: 4.0000 mg | ORAL_TABLET | Freq: Every evening | ORAL | 5 refills | Status: DC | PRN

## 2023-08-11 MED ORDER — ZONISAMIDE 100 MG PO CAPS: 200.0000 mg | ORAL_CAPSULE | Freq: Every day | ORAL | 5 refills | Status: DC

## 2023-08-11 MED ORDER — NURTEC 75 MG PO TBDP: 75.0000 mg | ORAL_TABLET | Freq: Every day | ORAL | 5 refills | Status: DC | PRN

## 2023-08-14 ENCOUNTER — Encounter: Payer: Self-pay | Admitting: Podiatry

## 2023-08-14 ENCOUNTER — Ambulatory Visit: Payer: Medicaid Other | Admitting: Podiatry

## 2023-08-14 DIAGNOSIS — L600 Ingrowing nail: Secondary | ICD-10-CM | POA: Diagnosis not present

## 2023-08-14 NOTE — Patient Instructions (Signed)

## 2023-08-14 NOTE — Progress Notes (Signed)
Subjective:   Patient ID: Ann Rice, female   DOB: 32 y.o.   MRN: [id number]   HPI Patient presents with painful ingrown of the left big toe stating that it has become aggravated recently   ROS      Objective:  Physical Exam  Neurovascular status intact incurvated medial border left big toe slight redness no drainage noted moderate pain     Assessment:  Ingrown toenail deformity left hallux medial border with pain     Plan:  H&P reviewed recommended correction allowed her to read signed consent for infiltrated the left big toe 60 mg like Marcaine mixture sterile prep done using sterile instrumentation remove the border exposed matrix applied sodium hydroxide 3 applications 8 seconds followed by acetic acid lavage and sterile dressing.  Gave instructions on soaks wear dressing 24 hours to get up earlier if throbbing were to occur and encouraged to call with questions concerns which may arise

## 2023-08-15 ENCOUNTER — Encounter: Payer: Self-pay | Admitting: Podiatry

## 2023-08-22 ENCOUNTER — Ambulatory Visit: Payer: Medicaid Other | Admitting: Neurology

## 2023-09-19 DIAGNOSIS — M542 Cervicalgia: Secondary | ICD-10-CM | POA: Diagnosis not present

## 2023-09-26 ENCOUNTER — Telehealth: Payer: Self-pay

## 2023-09-26 ENCOUNTER — Encounter: Payer: Self-pay | Admitting: Neurology

## 2023-09-26 NOTE — Telephone Encounter (Signed)
Per Patient PA needed for Nurtec due to new insurance

## 2023-09-29 NOTE — Progress Notes (Unsigned)
NEUROLOGY FOLLOW UP OFFICE NOTE  Ann Rice [id number]  Assessment/Plan:   Chronic migraine without aura, without status migrainosus, not intractable   Headache prevention:  Plan to start Emgality.  Continue zonisamide 200mg  daily for now with plan to taper off  Migraine rescue:  Instead of Nurtec, will have her try samples of Ubrelvy 100mg  PT of neck as per orthopedics Limit use of pain relievers to no more than 2 days out of week to prevent risk of rebound or medication-overuse headache. Keep headache diary Follow up 6 months.     Subjective:  Ann Rice is a 32 year old right-handed female with psoriasis who follows up for headaches   UPDATE: Last seen in March.  Increased zonisamide to 200mg  daily. Also changed from cyclobenzaprine to tizanidine. No improvement She had one severe migraine since March. Averages 3-4 headache days a week.  Nurtec reduces severity in 1-2 hours but low-grade headache persists until she naps for 3-4 hours.   Neck pain has increased, particularly at night.  Saw an orthopedist.  X-ray reportedly had straightening of normal lordosis but no acute findings.  Received steroid shot and recommended PT.  Tizanidine ineffective.      Current NSAIDS/analgesics: Excedrin Current triptans:  none Current ergotamine:  none Current anti-emetic:  none Current muscle relaxants:  tizanidine 4mg  at bedtime Current Antihypertensive medications:  none Current Antidepressant medications:  duloxetine 30mg  daily Current Anticonvulsant medications:  zonisamide 200mg  dialy Current anti-CGRP:  Nurtec PRN  Current Antihistamines/Decongestants:  Xyzal Other therapy:  heating pad to the neck Hormone/birth control:  Mirena Other medications:  Latuda   Drinks 1 iced coffee in morning and sweet tea throughout the day.  Does not drink water.  Sometimes skips meals.  She is able to fall asleep but wakes up once or twice in the night but able to quickly fall  asleep.  She used to be able to sleep through the night.       HISTORY:  She had had headaches as a child which eventually resolved.  She started having headaches again in late [facility] December 2021.  She describes a severe pulsating pain in the left parietal region with nausea, photophobia, phonophobia, blurred vision and sometimes vomiting.  No numbness or weakness.  They would last 10-12 hours and occur 3 to 4 times a week.  One time, she had an intractable headache lasting 2 1/2 to 3 days, requiring a Toradol shot.  She was unable to drive home from work.  Triggers include stress, caffeine withdrawal and possibly vaping.  Sleep helped relieve it.  She thinks it work-related stress was the primary trigger.  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  She left her job and headaches pretty much resolved.  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       Past NSAIDS/analgesics:  Excedrin Past abortive triptans:  sumatriptan, rizatriptan Past abortive ergotamine:  none Past muscle relaxants:  none Past anti-emetic:  Zofran, promethazine Past antihypertensive medications:  propranolol Past antidepressant medications:  Citalopram 30mg  QD Past anticonvulsant medications:  topiramate 50mg  at bedtime Past anti-CGRP:  none Past vitamins/Herbal/Supplements:  none Past antihistamines/decongestants:  Loratidine, hydroxyzine Other past therapies:  none    PAST MEDICAL HISTORY: 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HCC)    Depression    Ear itch  Headache    Loose stools    Migraine     MEDICATIONS: Current Outpatient Medications on File Prior to Visit  Medication Sig Dispense Refill   DULoxetine (CYMBALTA) 30 MG capsule Take 30 mg by mouth daily.     hydrOXYzine (ATARAX) 25 MG tablet Take 25 mg by mouth as needed.      ketoconazole (NIZORAL) 2 % cream Apply 1 Application topically daily as needed for irritation.     levocetirizine (XYZAL) 5 MG tablet Take 1 tablet by mouth daily.     levonorgestrel (MIRENA) 20 MCG/DAY IUD 1 each by Intrauterine route once.     metroNIDAZOLE (FLAGYL) 500 MG tablet Take 500 mg by mouth 4 (four) times daily.     omeprazole (PRILOSEC) 40 MG capsule Take 40 mg by mouth every morning.     Rimegepant Sulfate (NURTEC) 75 MG TBDP Take 1 tablet (75 mg total) by mouth daily as needed. 32 tablet 5   tiZANidine (ZANAFLEX) 4 MG tablet Take 1 tablet (4 mg total) by mouth at bedtime as needed for muscle spasms. 30 tablet 5   zonisamide (ZONEGRAN) 100 MG capsule Take 2 capsules (200 mg total) by mouth daily. 60 capsule 5   No current facility-administered medications on file prior to visit.    ALLERGIES: No Known Allergies   FAMILY HISTORY: Family History  Problem Relation Age of Onset   Anxiety disorder Mother    Cancer Maternal Grandfather        lung    Cancer Paternal Grandmother 34       lung cancer    Diabetes Neg Hx    Heart disease Neg Hx       Objective:  Blood pressure 127/85, pulse 100, height 5\' 3"  (1.6 m), weight 213 lb (96.6 kg), SpO2 98%. General: No acute distress.  Patient appears well-groomed.      Shon Millet, DO  CC: Ricky Stabs, NP

## 2023-09-30 ENCOUNTER — Encounter: Payer: Self-pay | Admitting: Neurology

## 2023-09-30 ENCOUNTER — Ambulatory Visit: Payer: Medicaid Other | Admitting: Neurology

## 2023-09-30 VITALS — BP 127/85 | HR 100 | Ht 63.0 in | Wt 213.0 lb

## 2023-09-30 DIAGNOSIS — G43709 Chronic migraine without aura, not intractable, without status migrainosus: Secondary | ICD-10-CM | POA: Diagnosis not present

## 2023-09-30 NOTE — Progress Notes (Signed)
Medication Samples have been provided to the patient.  Drug name: Bernita Raisin       Strength: 100 mg        Qty: 4  LOT: [id number]  Exp.Date: 12/2024  Dosing instructions: as needed  The patient has been instructed regarding the correct time, dose, and frequency of taking this medication, including desired effects and most common side effects.   Leida Lauth 9:16 AM 09/30/2023

## 2023-09-30 NOTE — Patient Instructions (Signed)
Start Emgality - 2 injections first dose, then 1 injection every 28 days thereafter.  Contact me when you pick up first dose and I will send in for standing order.  Make sure you have 2 pens for first dose Continue zonisamide for now Stop Nurtec.  Try Bernita Raisin at earliest onset of migraine.  May repeat in 2 hours.  Maximum 2 tablets in 24 hours.  Let me know if this works Start the physical therapy of neck as per orthopedicis Follow up 6 months.

## 2023-10-01 ENCOUNTER — Encounter: Payer: Self-pay | Admitting: Neurology

## 2023-10-02 ENCOUNTER — Other Ambulatory Visit: Payer: Self-pay

## 2023-10-02 ENCOUNTER — Other Ambulatory Visit: Payer: Self-pay | Admitting: Neurology

## 2023-10-02 MED ORDER — UBRELVY 100 MG PO TABS: 1.0000 | ORAL_TABLET | ORAL | 11 refills | Status: DC | PRN

## 2023-10-02 MED ORDER — EMGALITY 120 MG/ML ~~LOC~~ SOAJ: 240.0000 mg | Freq: Once | SUBCUTANEOUS | 0 refills | Status: AC

## 2023-10-02 NOTE — Progress Notes (Unsigned)
PA needed for Emgality.

## 2023-10-03 ENCOUNTER — Other Ambulatory Visit: Payer: Self-pay | Admitting: Neurology

## 2023-10-03 MED ORDER — EMGALITY 120 MG/ML ~~LOC~~ SOAJ
120.0000 mg | SUBCUTANEOUS | 11 refills | Status: DC
  Filled 2023-10-29: qty 1, 28d supply, fill #0
  Filled 2023-11-21: qty 1, 28d supply, fill #1
  Filled 2023-12-20: qty 1, 28d supply, fill #2
  Filled 2024-01-16: qty 1, 30d supply, fill #3
  Filled 2024-02-15: qty 1, 30d supply, fill #4
  Filled 2024-03-17: qty 1, 30d supply, fill #5
  Filled 2024-04-12: qty 1, 30d supply, fill #6

## 2023-10-06 DIAGNOSIS — L700 Acne vulgaris: Secondary | ICD-10-CM | POA: Diagnosis not present

## 2023-10-08 DIAGNOSIS — S46811D Strain of other muscles, fascia and tendons at shoulder and upper arm level, right arm, subsequent encounter: Secondary | ICD-10-CM | POA: Diagnosis not present

## 2023-10-09 ENCOUNTER — Telehealth: Payer: Self-pay

## 2023-10-09 NOTE — Telephone Encounter (Signed)
PA needed for Emgality.

## 2023-10-21 ENCOUNTER — Telehealth: Payer: Self-pay | Admitting: Pharmacy Technician

## 2023-10-21 ENCOUNTER — Other Ambulatory Visit (HOSPITAL_COMMUNITY): Payer: Self-pay

## 2023-10-21 NOTE — Telephone Encounter (Signed)
Pharmacy Patient Advocate Encounter   Received notification from Pt Calls Messages that prior authorization for UBRELVY 100MG  is required/requested.   Insurance verification completed.   The patient is insured through [facility] .   Per test claim: PA required; PA submitted to above mentioned insurance via CoverMyMeds Key/confirmation #/EOC [id number] Status is pending  Faxed to plan [phone]

## 2023-10-21 NOTE — Telephone Encounter (Signed)
PA has been submitted, and telephone encounter has been created. 

## 2023-10-21 NOTE — Telephone Encounter (Signed)
Pharmacy Patient Advocate Encounter   Received notification from Pt Calls Messages that prior authorization for [facility] 120MG  is required/requested.   Insurance verification completed.   The patient is insured through [facility] .   Per test claim: PA required; PA submitted to above mentioned insurance via CoverMyMeds Key/confirmation #/EOC [id number] Status is pending

## 2023-10-28 ENCOUNTER — Other Ambulatory Visit (HOSPITAL_COMMUNITY): Payer: Self-pay

## 2023-10-28 NOTE — Telephone Encounter (Signed)
Pharmacy Patient Advocate Encounter  Received notification from [facility] that Prior Authorization for Ubrelvy 100MG  tablets has been APPROVED from 10-21-2023 to 10-20-2024. Ran test claim, Copay is $0.00. Quantity approved 16 tablets per 30 days. This test claim was processed through [facility]- copay amounts may vary at other pharmacies due to pharmacy/plan contracts, or as the patient moves through the different stages of their insurance plan.   PA #/Case ID/Reference #: [id number]

## 2023-10-29 ENCOUNTER — Other Ambulatory Visit (HOSPITAL_COMMUNITY): Payer: Self-pay

## 2023-10-30 ENCOUNTER — Other Ambulatory Visit (HOSPITAL_COMMUNITY): Payer: Self-pay

## 2023-11-05 DIAGNOSIS — S46811D Strain of other muscles, fascia and tendons at shoulder and upper arm level, right arm, subsequent encounter: Secondary | ICD-10-CM | POA: Diagnosis not present

## 2023-11-17 DIAGNOSIS — S46811D Strain of other muscles, fascia and tendons at shoulder and upper arm level, right arm, subsequent encounter: Secondary | ICD-10-CM | POA: Diagnosis not present

## 2023-11-19 DIAGNOSIS — H938X3 Other specified disorders of ear, bilateral: Secondary | ICD-10-CM | POA: Diagnosis not present

## 2023-11-19 DIAGNOSIS — L2989 Other pruritus: Secondary | ICD-10-CM | POA: Diagnosis not present

## 2023-11-20 ENCOUNTER — Encounter: Payer: Self-pay | Admitting: Neurology

## 2023-11-22 ENCOUNTER — Other Ambulatory Visit (HOSPITAL_COMMUNITY): Payer: Self-pay

## 2023-11-22 DIAGNOSIS — M542 Cervicalgia: Secondary | ICD-10-CM | POA: Diagnosis not present

## 2023-11-27 ENCOUNTER — Telehealth: Payer: Self-pay | Admitting: Family

## 2023-11-27 DIAGNOSIS — N644 Mastodynia: Secondary | ICD-10-CM | POA: Diagnosis not present

## 2023-12-06 DIAGNOSIS — F329 Major depressive disorder, single episode, unspecified: Secondary | ICD-10-CM | POA: Diagnosis not present

## 2023-12-06 DIAGNOSIS — G43909 Migraine, unspecified, not intractable, without status migrainosus: Secondary | ICD-10-CM | POA: Diagnosis not present

## 2023-12-06 DIAGNOSIS — Z6835 Body mass index (BMI) 35.0-35.9, adult: Secondary | ICD-10-CM | POA: Diagnosis not present

## 2023-12-06 DIAGNOSIS — E669 Obesity, unspecified: Secondary | ICD-10-CM | POA: Diagnosis not present

## 2023-12-06 DIAGNOSIS — J301 Allergic rhinitis due to pollen: Secondary | ICD-10-CM | POA: Diagnosis not present

## 2023-12-08 DIAGNOSIS — M542 Cervicalgia: Secondary | ICD-10-CM | POA: Diagnosis not present

## 2023-12-22 ENCOUNTER — Encounter: Payer: Self-pay | Admitting: Neurology

## 2023-12-22 ENCOUNTER — Other Ambulatory Visit (HOSPITAL_COMMUNITY): Payer: Self-pay

## 2023-12-22 ENCOUNTER — Other Ambulatory Visit: Payer: Self-pay

## 2023-12-23 DIAGNOSIS — Z20822 Contact with and (suspected) exposure to covid-19: Secondary | ICD-10-CM | POA: Diagnosis not present

## 2023-12-23 DIAGNOSIS — Z6835 Body mass index (BMI) 35.0-35.9, adult: Secondary | ICD-10-CM | POA: Diagnosis not present

## 2023-12-23 DIAGNOSIS — J00 Acute nasopharyngitis [common cold]: Secondary | ICD-10-CM | POA: Diagnosis not present

## 2023-12-23 DIAGNOSIS — J029 Acute pharyngitis, unspecified: Secondary | ICD-10-CM | POA: Diagnosis not present

## 2023-12-23 DIAGNOSIS — Z1152 Encounter for screening for COVID-19: Secondary | ICD-10-CM | POA: Diagnosis not present

## 2023-12-24 ENCOUNTER — Encounter: Payer: Self-pay | Admitting: Gastroenterology

## 2023-12-24 ENCOUNTER — Telehealth: Payer: Self-pay

## 2023-12-24 ENCOUNTER — Telehealth: Payer: Self-pay | Admitting: Gastroenterology

## 2023-12-24 DIAGNOSIS — R300 Dysuria: Secondary | ICD-10-CM | POA: Diagnosis not present

## 2023-12-24 NOTE — Telephone Encounter (Signed)
 PA needed for [employer]

## 2023-12-24 NOTE — Telephone Encounter (Signed)
 The patient called in and left a voicemail wanting a same day appointment. I called her letting her know we received her message, and I let her know that we don't have same day appointment for the providers. I offered her two different dates and with the time. I schedule her with Dr. Unk on 01/07/24 at 1:30 pm and adding her to the listing for a sooner appointment.

## 2023-12-25 ENCOUNTER — Telehealth: Payer: Self-pay | Admitting: Family

## 2023-12-25 NOTE — Telephone Encounter (Signed)
 Ann Rice

## 2023-12-30 DIAGNOSIS — M791 Myalgia, unspecified site: Secondary | ICD-10-CM | POA: Diagnosis not present

## 2023-12-30 DIAGNOSIS — R112 Nausea with vomiting, unspecified: Secondary | ICD-10-CM | POA: Diagnosis not present

## 2023-12-30 DIAGNOSIS — Z1152 Encounter for screening for COVID-19: Secondary | ICD-10-CM | POA: Diagnosis not present

## 2023-12-30 DIAGNOSIS — J0110 Acute frontal sinusitis, unspecified: Secondary | ICD-10-CM | POA: Diagnosis not present

## 2023-12-30 DIAGNOSIS — R197 Diarrhea, unspecified: Secondary | ICD-10-CM | POA: Diagnosis not present

## 2023-12-30 DIAGNOSIS — Z6834 Body mass index (BMI) 34.0-34.9, adult: Secondary | ICD-10-CM | POA: Diagnosis not present

## 2023-12-30 DIAGNOSIS — Z20822 Contact with and (suspected) exposure to covid-19: Secondary | ICD-10-CM | POA: Diagnosis not present

## 2024-01-02 ENCOUNTER — Telehealth: Payer: Self-pay | Admitting: Pharmacy Technician

## 2024-01-02 ENCOUNTER — Other Ambulatory Visit (HOSPITAL_COMMUNITY): Payer: Self-pay

## 2024-01-02 NOTE — Telephone Encounter (Signed)
PA request has been Submitted. New Encounter created for follow up. For additional info see Pharmacy Prior Auth telephone encounter from 01/02/2024.

## 2024-01-02 NOTE — Telephone Encounter (Signed)
Pharmacy Patient Advocate Encounter   Received notification from Pt Calls Messages that prior authorization for Emgality 120MG /ML auto-injectors (migraine) is required/requested.   Insurance verification completed.   The patient is insured through [facility] .   Per test claim: PA required; PA started via CoverMyMeds. KEY [id number] . Waiting for clinical questions to populate.

## 2024-01-07 ENCOUNTER — Encounter: Payer: Self-pay | Admitting: Gastroenterology

## 2024-01-07 ENCOUNTER — Ambulatory Visit: Payer: 59 | Admitting: Gastroenterology

## 2024-01-07 VITALS — BP 126/82 | HR 82 | Temp 98.1°F | Ht 63.0 in | Wt 214.5 lb

## 2024-01-07 DIAGNOSIS — Z8719 Personal history of other diseases of the digestive system: Secondary | ICD-10-CM

## 2024-01-07 DIAGNOSIS — Z09 Encounter for follow-up examination after completed treatment for conditions other than malignant neoplasm: Secondary | ICD-10-CM

## 2024-01-07 DIAGNOSIS — K219 Gastro-esophageal reflux disease without esophagitis: Secondary | ICD-10-CM

## 2024-01-07 NOTE — Progress Notes (Signed)
Arlyss Repress, MD [address]  [address]  [place], [place] [address]  Main: [phone]  Fax: [phone]    Gastroenterology Consultation  Referring Provider:     Rema Fendt, NP Primary Care Physician:  Rema Fendt, NP Primary Gastroenterologist:  Dr. Arlyss Repress Reason for Consultation: Acid reflux        HPI:   Ann Rice is a 33 y.o. female referred by still waiting to do undergrad [employer] okay so what point Rema Fendt, NP  for consultation & management of loose stools.  Patient reports that she had a migraine headache in August, tested positive for COVID.  She noticed that since then she has been having loose brown bowel movements, 1-2 times daily, feels gassy all the time.  She also experiences vomiting of stomach.  She denies any abdominal bloating, abdominal pain, nausea or vomiting.  She denies any rectal bleeding.  She was trying to associate her symptoms with COVID.  She also reports having mental health issues including anxiety and depression and wondering if her symptoms are secondary to that and she is not on any treatment for it.  She is currently being treated for her migraine headaches.  She had labs done in February 2023 including CBC, CMP, TSH, hemoglobin A1c which are unremarkable.  Follow-up visit 01/07/2024 Ann Rice is here to discuss about recent flareup of acid reflux which was about a week ago.  She had URI followed by norovirus resulting in severe nausea and vomiting.  This has resulted in severe burning in her throat and started taking omeprazole 20 mg daily which resolved her heartburn.  She made a follow-up appointment to feel reassured about any further workup if necessary.  About 2 years ago, she had flareup of acid reflux which resolved with omeprazole 40 mg once daily for 2 months.  She did not have any flareups in between.  She does admit to eating comfort foods that has been causing some weight gain, attributes it to  her underlying mental health but thinks she is getting better  She does vape nicotine approximately started 3 years ago She does not smoke marijuana, denies any IV drug abuse, denies any alcohol use  NSAIDs: Excedrin once a week for migraine headaches  Antiplts/Anticoagulants/Anti thrombotics: None  GI Procedures:  Colonoscopy 10/24/2022 - Erythematous mucosa in the terminal ileum. Biopsied. - Normal mucosa in the entire examined colon. - The distal rectum and anal verge are normal on retroflexion view. DIAGNOSIS:  A. TERMINAL ILEUM; COLD BIOPSY:  - FRAGMENTS OF SMALL BOWEL MUCOSA WITH FOCAL BENIGN LYMPHOID  HYPERPLASIA.  - NORMAL VILLOGLANDULAR ARCHITECTURE, NO SIGNIFICANT ACUTE OR CHRONIC  INFLAMMATION, GRANULOMA AND MALIGNANCY IDENTIFIED.   Father with lymphocytic colitis  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HCC)    Depression    Ear itch    Headache    Loose stools    Migraine     Past Surgical History:  Procedure Laterality Date   BLADDER SURGERY     CESAREAN SECTION N/A 08/20/2019   Procedure: CESAREAN SECTION;  Surgeon: Carrington Clamp, MD;  Location: [facility] LD ORS;  Service: Obstetrics;  Laterality: N/A;   COLONOSCOPY WITH PROPOFOL N/A 10/24/2022   Procedure: COLONOSCOPY WITH PROPOFOL;  Surgeon: Toney Reil, MD;  Location: [facility] ENDOSCOPY;  Service: Gastroenterology;  Laterality: N/A;   FOOT SURGERY Left 2008   great toe broken, pin placed and removed  TONSILLECTOMY     At age of 40   URETERAL EXPLORATION     WISDOM TOOTH EXTRACTION      Current Outpatient Medications:    DULoxetine (CYMBALTA) 30 MG capsule, Take 30 mg by mouth daily., Disp: , Rfl:    Galcanezumab-gnlm (EMGALITY) 120 MG/ML SOAJ, Inject 120 mg into the skin every 28 (twenty-eight) days., Disp: 1 mL, Rfl: 11   hydrOXYzine (ATARAX) 25 MG tablet, Take 25 mg by mouth as needed., Disp: , Rfl:    levocetirizine (XYZAL) 5 MG tablet, Take 1 tablet  by mouth daily., Disp: , Rfl:    levonorgestrel (MIRENA) 20 MCG/DAY IUD, 1 each by Intrauterine route once., Disp: , Rfl:    Ubrogepant (UBRELVY) 100 MG TABS, Take 1 tablet (100 mg total) by mouth as needed. May repeat in 2 hours.  Maximum 2 tablets in 24 hours., Disp: 16 tablet, Rfl: 11  Family History  Problem Relation Age of Onset   Anxiety disorder Mother    Cancer Maternal Grandfather        lung    Cancer Paternal Grandmother 43       lung cancer    Diabetes Neg Hx    Heart disease Neg Hx      Social History   Tobacco Use   Smoking status: Former    Types: Cigars    Passive exposure: Current   Smokeless tobacco: Current  Vaping Use   Vaping status: Every Day   Substances: Nicotine  Substance Use Topics   Alcohol use: Yes    Comment: social   Drug use: No    Allergies as of 01/07/2024   (No Known Allergies)    Review of Systems:    All systems reviewed and negative except where noted in HPI.   Physical Exam:  BP 126/82 (BP Location: Right Arm, Patient Position: Sitting, Cuff Size: Normal)   Pulse 82   Temp 98.1 F (36.7 C) (Oral)   Ht 5\' 3"  (1.6 m)   Wt 214 lb 8 oz (97.3 kg)   BMI 38.00 kg/m  No LMP recorded. Patient has had an injection.  General:   Alert,  Well-developed, well-nourished, pleasant and cooperative in NAD Head:  Normocephalic and atraumatic. Eyes:  Sclera clear, no icterus.   Conjunctiva pink. Ears:  Normal auditory acuity. Nose:  No deformity, discharge, or lesions. Mouth:  No deformity or lesions,oropharynx pink & moist. Neck:  Supple; no masses or thyromegaly. Lungs:  Respirations even and unlabored.  Clear throughout to auscultation.   No wheezes, crackles, or rhonchi. No acute distress. Heart:  Regular rate and rhythm; no murmurs, clicks, rubs, or gallops. Abdomen:  Normal bowel sounds. Soft, non-tender and non-distended without masses, hepatosplenomegaly or hernias noted.  No guarding or rebound tenderness.   Rectal: Not  performed Msk:  Symmetrical without gross deformities. Good, equal movement & strength bilaterally. Pulses:  Normal pulses noted. Extremities:  No clubbing or edema.  No cyanosis. Neurologic:  Alert and oriented x3;  grossly normal neurologically. Skin:  Intact without significant lesions or rashes. No jaundice. Psych:  Alert and cooperative. Normal mood and affect.  Imaging Studies: No abdominal imaging recently  Assessment and Plan:   Ann Rice is a 33 y.o. female with history of anxiety, depression is seen in consultation for recent history of acid reflux triggered after URI and norovirus, resolved with over-the-counter omeprazole 20 mg daily.  Patient is currently symptomatic Provided reassurance Switch to Pepcid as needed for heartburn Discussed about  antireflux lifestyle Discussed about weight loss by adopting healthy eating habits and incorporating regular exercise   Follow up as needed   Arlyss Repress, MD

## 2024-01-15 ENCOUNTER — Other Ambulatory Visit (HOSPITAL_COMMUNITY): Payer: Self-pay

## 2024-01-15 NOTE — Telephone Encounter (Signed)
PA returned a NA response and said it was closed. Said to resubmit if PA is still needed. Per test claim, med can be filled, however the current PA expires on 01/20/24.  Attempting to renew PA in [facility], but I'm waiting on clinical questions to populate. [id number]

## 2024-01-15 NOTE — Telephone Encounter (Signed)
Clinical questions have been answered and PA submitted. PA currently Pending. [id number]

## 2024-01-16 ENCOUNTER — Other Ambulatory Visit (HOSPITAL_COMMUNITY): Payer: Self-pay

## 2024-01-16 NOTE — Telephone Encounter (Signed)
 Clinical questions have been answered and PA submitted. PA currently Pending.

## 2024-01-17 ENCOUNTER — Other Ambulatory Visit (HOSPITAL_COMMUNITY): Payer: Self-pay

## 2024-01-19 ENCOUNTER — Other Ambulatory Visit (HOSPITAL_COMMUNITY): Payer: Self-pay

## 2024-01-19 NOTE — Telephone Encounter (Signed)
Pharmacy Patient Advocate Encounter  Received notification from [facility] that Prior Authorization for [facility] 120MG  has been APPROVED from 1.31.25 to 1.31.26. Ran test claim, Copay is $20. This test claim was processed through [facility]- copay amounts may vary at other pharmacies due to pharmacy/plan contracts, or as the patient moves through the different stages of their insurance plan.   PA #/Case ID/Reference #: [id number]

## 2024-02-10 ENCOUNTER — Telehealth: Payer: Self-pay | Admitting: Family

## 2024-02-10 DIAGNOSIS — Z01419 Encounter for gynecological examination (general) (routine) without abnormal findings: Secondary | ICD-10-CM | POA: Diagnosis not present

## 2024-02-10 DIAGNOSIS — L709 Acne, unspecified: Secondary | ICD-10-CM | POA: Insufficient documentation

## 2024-02-10 DIAGNOSIS — N760 Acute vaginitis: Secondary | ICD-10-CM | POA: Diagnosis not present

## 2024-02-10 DIAGNOSIS — Z124 Encounter for screening for malignant neoplasm of cervix: Secondary | ICD-10-CM | POA: Diagnosis not present

## 2024-02-10 DIAGNOSIS — Z0000 Encounter for general adult medical examination without abnormal findings: Secondary | ICD-10-CM | POA: Diagnosis not present

## 2024-02-10 NOTE — Telephone Encounter (Signed)
 Ann Rice

## 2024-02-16 ENCOUNTER — Other Ambulatory Visit (HOSPITAL_COMMUNITY): Payer: Self-pay

## 2024-02-23 DIAGNOSIS — R0981 Nasal congestion: Secondary | ICD-10-CM | POA: Diagnosis not present

## 2024-02-23 DIAGNOSIS — R070 Pain in throat: Secondary | ICD-10-CM | POA: Diagnosis not present

## 2024-02-23 DIAGNOSIS — G44099 Other trigeminal autonomic cephalgias (TAC), not intractable: Secondary | ICD-10-CM | POA: Diagnosis not present

## 2024-02-23 DIAGNOSIS — R059 Cough, unspecified: Secondary | ICD-10-CM | POA: Diagnosis not present

## 2024-03-03 DIAGNOSIS — F411 Generalized anxiety disorder: Secondary | ICD-10-CM | POA: Diagnosis not present

## 2024-03-03 DIAGNOSIS — F331 Major depressive disorder, recurrent, moderate: Secondary | ICD-10-CM | POA: Diagnosis not present

## 2024-03-11 DIAGNOSIS — J302 Other seasonal allergic rhinitis: Secondary | ICD-10-CM | POA: Diagnosis not present

## 2024-03-11 DIAGNOSIS — G43109 Migraine with aura, not intractable, without status migrainosus: Secondary | ICD-10-CM | POA: Diagnosis not present

## 2024-03-11 DIAGNOSIS — R748 Abnormal levels of other serum enzymes: Secondary | ICD-10-CM | POA: Diagnosis not present

## 2024-03-11 DIAGNOSIS — F331 Major depressive disorder, recurrent, moderate: Secondary | ICD-10-CM | POA: Diagnosis not present

## 2024-03-11 DIAGNOSIS — E66812 Obesity, class 2: Secondary | ICD-10-CM | POA: Diagnosis not present

## 2024-03-11 DIAGNOSIS — R7302 Impaired glucose tolerance (oral): Secondary | ICD-10-CM | POA: Diagnosis not present

## 2024-03-11 DIAGNOSIS — Z6837 Body mass index (BMI) 37.0-37.9, adult: Secondary | ICD-10-CM | POA: Diagnosis not present

## 2024-03-17 ENCOUNTER — Other Ambulatory Visit (HOSPITAL_COMMUNITY): Payer: Self-pay

## 2024-03-30 ENCOUNTER — Ambulatory Visit: Payer: 59 | Admitting: Neurology

## 2024-03-30 NOTE — Telephone Encounter (Signed)
 Pharmacy Patient Advocate Encounter  Received notification from [facility] that Prior Authorization for [facility] 120MG  has been APPROVED from 11.4.24 to 2.4.25   PA #/Case ID/Reference #: [id number]

## 2024-04-13 ENCOUNTER — Other Ambulatory Visit: Payer: Self-pay

## 2024-04-19 NOTE — Progress Notes (Unsigned)
 NEUROLOGY FOLLOW UP OFFICE NOTE  Ann Rice [id number]  Assessment/Plan:   Tension type headache Migraine without aura, without status migrainosus, not intractable Cervicalgia   Headache prevention: Emgality  Migraine rescue:  Ubrelvy  100mg  Refer to Sports Medicine for possible OMM for neck pain Limit use of pain relievers to no more than 9 days out of the month to prevent risk of rebound or medication-overuse headache. Keep headache diary Follow up 6 months.     Subjective:  Ann Rice is a 33 year old right-handed female with psoriasis who follows up for headaches   UPDATE: Started Emgality  and off zonisamide .   Only one migraine, 3 hours with Ubrelvy . Mostly experiencing tension type headaches.  Occurs 2 to 3 days a week.  Experiencing neck pain.  Finished PT without much improvement.  MRI of C-spine without contrast on 11/22/2023 showed mild noncompressive disc bulging at C3-4 and C4-5 without significant stenosis or impingement and mild left-sided facet hypertrophy at C7-T1 without stenosisOrthopedics said there wasn't anything they can offer.   Intensity:  moderate to severe Duration:  3 with Ubrelvy  (repeats dose) Frequency:  2-3 days a week (only once was severe)    Current NSAIDS/analgesics: meloxicam (for arthritis in neck - rarely takes) Current triptans:  none Current ergotamine:  none Current anti-emetic:  none Current muscle relaxants:  tizanidine  4mg  at bedtime Current Antihypertensive medications:  none Current Antidepressant medications:  duloxetine 30mg  daily Current Anticonvulsant medications:  none Current anti-CGRP:  Emgality , Ubrelvy  100mg   Current Antihistamines/Decongestants:  Xyzal Other therapy:  heating pad to the neck Hormone/birth control:  Mirena Other medications:  Latuda    Drinks 1 iced coffee in morning and sweet tea throughout the day.  Does not drink water.  Sometimes skips meals.  She is able to fall asleep but wakes up  once or twice in the night but able to quickly fall asleep.  She used to be able to sleep through the night.       HISTORY:  She had had headaches as a child which eventually resolved.  She started having headaches again in late [facility] December 2021.  She describes a severe pulsating pain in the left parietal region with nausea, photophobia, phonophobia, blurred vision and sometimes vomiting.  No numbness or weakness.  They would last 10-12 hours and occur 3 to 4 times a week.  One time, she had an intractable headache lasting 2 1/2 to 3 days, requiring a Toradol  shot.  She was unable to drive home from work.  Triggers include stress, caffeine withdrawal and possibly vaping.  Sleep helped relieve it.  She thinks it work-related stress was the primary trigger.  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  She left her job and headaches pretty much resolved.  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      Past NSAIDS/analgesics:  Excedrin, ibuprofen , Tylenol  Past abortive triptans:  sumatriptan , rizatriptan Past abortive ergotamine:  none Past muscle relaxants:  none Past anti-emetic:  Zofran , promethazine Past antihypertensive medications:  propranolol Past antidepressant medications:  Citalopram  30mg  QD Past anticonvulsant medications:  topiramate  50mg  at bedtime, zonisamide  200mg  daily Past anti-CGRP:  Nurtec PRN Past vitamins/Herbal/Supplements:  none Past antihistamines/decongestants:  Loratidine, hydroxyzine  Other past therapies:  PT/dry needling neck    PAST MEDICAL HISTORY: 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HCC)  Depression    Ear itch    Headache    Loose stools    Migraine     MEDICATIONS: Current Outpatient Medications on File Prior to Visit  Medication Sig Dispense Refill   DULoxetine  (CYMBALTA) 30 MG capsule Take 30 mg by mouth daily.     Galcanezumab -gnlm (EMGALITY ) 120 MG/ML SOAJ Inject 120 mg into the skin every 28 (twenty-eight) days. 1 mL 11   hydrOXYzine  (ATARAX ) 25 MG tablet Take 25 mg by mouth as needed.     levocetirizine (XYZAL) 5 MG tablet Take 1 tablet by mouth daily.     levonorgestrel (MIRENA) 20 MCG/DAY IUD 1 each by Intrauterine route once.     Ubrogepant  (UBRELVY ) 100 MG TABS Take 1 tablet (100 mg total) by mouth as needed. May repeat in 2 hours.  Maximum 2 tablets in 24 hours. 16 tablet 11   No current facility-administered medications on file prior to visit.    ALLERGIES: No Known Allergies   FAMILY HISTORY: Family History  Problem Relation Age of Onset   Anxiety disorder Mother    Cancer Maternal Grandfather        lung    Cancer Paternal Grandmother 42       lung cancer    Diabetes Neg Hx    Heart disease Neg Hx       Objective:  Blood pressure 118/79, pulse 91, height 5\' 4"  (1.626 m), weight 215 lb (97.5 kg), SpO2 98%.. General: No acute distress.  Patient appears well-groomed.   Head:  Normocephalic/atraumatic Neck:  Supple.  right paraspinal tenderness.  Full range of motion. Heart:  Regular rate and rhythm. Neuro:  Alert and oriented.  Speech fluent and not dysarthric.  Language intact.  CN II-XII intact.  Bulk and tone normal.  Muscle strength 5/5 throughout.  Deep tendon reflexes 2+ throughout.  Gait normal.  Romberg negative.    Janne Members, DO  CC: Lavona Pounds, NP

## 2024-04-20 ENCOUNTER — Ambulatory Visit: Admitting: Neurology

## 2024-04-20 ENCOUNTER — Encounter: Payer: Self-pay | Admitting: Neurology

## 2024-04-20 ENCOUNTER — Telehealth: Payer: Self-pay

## 2024-04-20 ENCOUNTER — Other Ambulatory Visit (HOSPITAL_COMMUNITY): Payer: Self-pay

## 2024-04-20 VITALS — BP 118/79 | HR 91 | Ht 64.0 in | Wt 215.0 lb

## 2024-04-20 DIAGNOSIS — G43009 Migraine without aura, not intractable, without status migrainosus: Secondary | ICD-10-CM

## 2024-04-20 DIAGNOSIS — M542 Cervicalgia: Secondary | ICD-10-CM | POA: Diagnosis not present

## 2024-04-20 DIAGNOSIS — G44219 Episodic tension-type headache, not intractable: Secondary | ICD-10-CM | POA: Diagnosis not present

## 2024-04-20 MED ORDER — UBRELVY 100 MG PO TABS: 1.0000 | ORAL_TABLET | ORAL | 11 refills | Status: AC | PRN

## 2024-04-20 MED ORDER — EMGALITY 120 MG/ML ~~LOC~~ SOAJ
120.0000 mg | SUBCUTANEOUS | 11 refills | Status: AC
  Filled 2024-04-20 – 2024-05-10 (×2): qty 1, 30d supply, fill #0
  Filled 2024-06-07: qty 1, 30d supply, fill #1
  Filled 2024-07-09: qty 1, 30d supply, fill #2
  Filled 2024-08-07 – 2024-08-17 (×3): qty 1, 30d supply, fill #3
  Filled 2024-09-13: qty 1, 30d supply, fill #4
  Filled 2024-10-08 – 2024-10-15 (×4): qty 1, 30d supply, fill #5
  Filled 2024-11-12: qty 1, 30d supply, fill #6
  Filled 2024-12-13: qty 1, 30d supply, fill #7
  Filled 2025-01-13: qty 1, 30d supply, fill #8

## 2024-04-20 NOTE — Telephone Encounter (Signed)
 PA needed for Ubrelvy.

## 2024-04-20 NOTE — Patient Instructions (Addendum)
 Refer to Sports Medicine for neck pain (Dr. Ronnell Coins or Dr. Ulysees Gander) Continue Emgality  Continue Ubrelvy  as needed

## 2024-04-21 ENCOUNTER — Other Ambulatory Visit (HOSPITAL_COMMUNITY): Payer: Self-pay

## 2024-04-21 ENCOUNTER — Encounter: Payer: Self-pay | Admitting: Dermatology

## 2024-04-21 ENCOUNTER — Ambulatory Visit (INDEPENDENT_AMBULATORY_CARE_PROVIDER_SITE_OTHER): Payer: Medicaid Other | Admitting: Dermatology

## 2024-04-21 ENCOUNTER — Telehealth: Payer: Self-pay

## 2024-04-21 VITALS — BP 105/69 | HR 92

## 2024-04-21 DIAGNOSIS — G43109 Migraine with aura, not intractable, without status migrainosus: Secondary | ICD-10-CM | POA: Insufficient documentation

## 2024-04-21 DIAGNOSIS — L219 Seborrheic dermatitis, unspecified: Secondary | ICD-10-CM

## 2024-04-21 DIAGNOSIS — L709 Acne, unspecified: Secondary | ICD-10-CM

## 2024-04-21 DIAGNOSIS — E66812 Obesity, class 2: Secondary | ICD-10-CM | POA: Insufficient documentation

## 2024-04-21 DIAGNOSIS — L700 Acne vulgaris: Secondary | ICD-10-CM

## 2024-04-21 DIAGNOSIS — R748 Abnormal levels of other serum enzymes: Secondary | ICD-10-CM | POA: Insufficient documentation

## 2024-04-21 DIAGNOSIS — L729 Follicular cyst of the skin and subcutaneous tissue, unspecified: Secondary | ICD-10-CM

## 2024-04-21 DIAGNOSIS — L723 Sebaceous cyst: Secondary | ICD-10-CM

## 2024-04-21 DIAGNOSIS — F331 Major depressive disorder, recurrent, moderate: Secondary | ICD-10-CM | POA: Insufficient documentation

## 2024-04-21 DIAGNOSIS — J302 Other seasonal allergic rhinitis: Secondary | ICD-10-CM | POA: Insufficient documentation

## 2024-04-21 DIAGNOSIS — Z6837 Body mass index (BMI) 37.0-37.9, adult: Secondary | ICD-10-CM | POA: Insufficient documentation

## 2024-04-21 DIAGNOSIS — R7302 Impaired glucose tolerance (oral): Secondary | ICD-10-CM | POA: Insufficient documentation

## 2024-04-21 MED ORDER — TRETINOIN 0.025 % EX CREA: TOPICAL_CREAM | Freq: Every day | CUTANEOUS | 4 refills | Status: AC

## 2024-04-21 MED ORDER — TRIAMCINOLONE ACETONIDE 10 MG/ML IJ SUSP
10.0000 mg | Freq: Once | INTRAMUSCULAR | Status: AC
  Administered 2024-04-21: 10 mg

## 2024-04-21 MED ORDER — SPIRONOLACTONE 100 MG PO TABS: 100.0000 mg | ORAL_TABLET | Freq: Every day | ORAL | 6 refills | Status: DC

## 2024-04-21 MED ORDER — CLINDAMYCIN PHOSPHATE 1 % EX SWAB: 1.0000 | Freq: Every morning | CUTANEOUS | 6 refills | Status: AC

## 2024-04-21 MED ORDER — KETOCONAZOLE 2 % EX SHAM
1.0000 | MEDICATED_SHAMPOO | Freq: Once | CUTANEOUS | 6 refills | Status: AC
Start: 2024-04-21 — End: 2024-04-21

## 2024-04-21 NOTE — Telephone Encounter (Signed)
 Pharmacy Patient Advocate Encounter   Received notification from Pt Calls Messages that prior authorization for Ubrelvy  100MG  tablets is required/requested.   Insurance verification completed.   The patient is insured through [facility] .   Per test claim: Refill too soon. PA is not needed at this time. Medication was filled 04.25.2025. Next eligible fill date is 05.10.2025. Pa in effective until 11.05.2025

## 2024-04-21 NOTE — Progress Notes (Signed)
 New Patient Visit   Subjective  Ann Rice is a 33 y.o. female who presents for the following: Acne, Dry scalp and Cyst on Right Ear lobe  Patient states she has acne, cyst on right ear lobe and scalp located at the scalp, face, and right earlobe that she would like to have examined. Patient reports the areas have been there for several years. She reports the scalp are bothersome. She states her scalp can get very itchy/ Patient rates irritation 7 out of 10.Patient report she is currently washing with Biolage shampoo and dove shampoo daily. She has also used Jojoba oil on her scalp with no improvement. Patient reports she has previously been treated for the acne by her OBGYN. She was prescribe Clindamycin  Gel and oral Doxycyline 2 times daily.   Her current her facial regimen consist of washing with dove unscented and applying Clindamycin  gel prn. She states she does get deeper bumps sporadically but not consistent with her menstrual because she has a IUD.  The cyst has been there for several years. The cyst in not symptomatic but she would like to have it evaluated.   The following portions of the chart were reviewed this encounter and updated as appropriate: medications, allergies, medical history  Review of Systems:  No other skin or systemic complaints except as noted in HPI or Assessment and Plan.  Objective  Well appearing patient in no apparent distress; mood and affect are within normal limits.  A focused examination was performed of the following areas: Face, Scalp, Right Earlobe  Relevant exam findings are noted in the Assessment and Plan.            Assessment & Plan    1. Acne - Assessment: Patient presents with acne on face, chest, and back. Previously prescribed doxycycline hyclate twice daily, which patient did not take due to uncertainty about administration with food. No prior use of topical treatments such as tretinoin  or adapalene. Current presentation  does not warrant systemic antibiotic therapy. - Plan:    Prescribe clindamycin  swabs for morning application to face, chest, and back    Prescribe tretinoin  0.025% cream for nighttime use     - Instructions: Apply pea-sized amount to entire face two nights per week for the first month, then increase to three nights per week (Monday, Wednesday, Friday)     - Advised to apply moisturizer after tretinoin     Prescribe spironolactone  for daily use with dinner or before bed     - Informed consent: Potential side effects include lightheadedness and dizziness; advised to stay hydrated    Provided samples of La Roche-Posay gel cleanser and moisturizer    Follow-up in 4 months to assess treatment efficacy  2. Seborrheic dermatitis - Assessment: Patient reports itchy, dandruffy scalp with excessive oiliness. Current presentation shows classic seborrheic dermatitis with yellow greasy flakes on the hairline. Patient currently uses Bilage shampoo and washes hair every other day or daily. - Plan:    Prescribe ketoconazole  shampoo    Recommend CeraVe 1% zinc shampoo    Instructions: Mix ketoconazole  and CeraVe zinc shampoos, apply to trouble areas, leave for 2 minutes, then rinse    Use every other day, increasing to daily if needed  3. Earlobe cyst - Assessment: Small cyst present in the right earlobe, likely related to previous piercing. - Plan:    Injected cyst with Kenalog  10, 0.05 cc    Advised patient that cyst should shrink within 2-3 weeks  Follow-up in  4 months to assess treatment efficacy for acne. Monitor seborrheic dermatitis and earlobe cyst for improvement. ACNE VULGARIS   Related Medications clindamycin  (CLEOCIN  T) 1 % SWAB Apply 1 Application topically in the morning. After washing the effected areas (Face, Chest and Neck) tretinoin  (RETIN-A ) 0.025 % cream Apply topically at bedtime. spironolactone  (ALDACTONE ) 100 MG tablet Take 1 tablet (100 mg total) by mouth daily. SEBACEOUS  CYST Right Ear Procedure Note Intralesional Injection  Location: Right Ear Lobe  Informed Consent: Discussed risks (infection, pain, bleeding, bruising, thinning of the skin, loss of skin pigment, lack of resolution, and recurrence of lesion) and benefits of the procedure, as well as the alternatives. Informed consent was obtained. Preparation: The area was prepared a standard fashion.  Anesthesia:None  Procedure Details: An intralesional injection was performed with Kenalog  10 mg/cc. 0.5 cc in total were injected. NDC #: 9604-5409-81 Exp: 02/2025  Total number of injections: 1  Plan: The patient was instructed on post-op care. Recommend OTC analgesia as needed for pain.  triamcinolone  acetonide (KENALOG ) 10 MG/ML injection 10 mg - Right Ear  SEBORRHEIC DERMATITIS   Related Medications ketoconazole  (NIZORAL ) 2 % shampoo Apply 1 Application topically once for 1 dose.  Return in about 4 months (around 08/22/2024) for Acne F/U.  I, Jetta Ager, am acting as Neurosurgeon for [employer], DO.  Documentation: I have reviewed the above documentation for accuracy and completeness, and I agree with the above.  Louana Roup, DO

## 2024-04-21 NOTE — Patient Instructions (Addendum)
 Hello Ann Rice,  Thank you for visiting today. Here is a summary of the key instructions:  Medications: - Apply clindamycin swab to face, chest, and back every morning - Apply tretinoin 0.025% cream to face, chest, and back:   - Use 1 pea-sized amount   - Start with 2 nights a week for the first month   - After 1 month, increase to 3 nights a week (Monday, Wednesday, Friday) - Take 1 spironolactone tablet every evening with dinner or before bed - Use ketoconazole shampoo mixed with CeraVe 1% zinc shampoo:   - Apply to scalp, let sit for 2 minutes, then rinse   - Use every other day, increase to daily if needed  Skin Care Routine: Morning: - Wash face with La Roche-Posay Gel Cleanser - Apply clindamycin swab - Apply moisturizer with sunscreen  Evening: - Wash face with La Roche-Posay Gel Cleanser - Apply tretinoin (on scheduled nights) - Apply moisturizer  General Instructions: - Keep medications on your sink to remember - Set an alarm for medication times - Link medication use with brushing teeth - Write instructions on mirror as a reminder  Side Effects to Watch For: - Tretinoin: stinging, burning, irritation   - If irritation occurs, pause for a week, then return to 2 nights per week - Spironolactone: lightheadedness, dizziness   - Drink enough water to prevent these effects  Follow-up: - Return for follow-up appointment in 4 months  Please reach out if you have any questions or concerns.  Warm regards,  Dr. Louana Roup, Dermatology        Recommended Sunscreen    Important Information   Due to recent changes in healthcare laws, you may see results of your pathology and/or laboratory studies on MyChart before the doctors have had a chance to review them. We understand that in some cases there may be results that are confusing or concerning to you. Please understand that not all results are received at the same time and often the doctors may need to  interpret multiple results in order to provide you with the best plan of care or course of treatment. Therefore, we ask that you please give us  2 business days to thoroughly review all your results before contacting the office for clarification. Should we see a critical lab result, you will be contacted sooner.     If You Need Anything After Your Visit   If you have any questions or concerns for your doctor, please call our main line at [phone]. If no one answers, please leave a voicemail as directed and we will return your call as soon as possible. Messages left after 4 pm will be answered the following business day.    You may also send us  a message via MyChart. We typically respond to MyChart messages within 1-2 business days.  For prescription refills, please ask your pharmacy to contact our office. Our fax number is [phone].  If you have an urgent issue when the clinic is closed that cannot wait until the next business day, you can page your doctor at the number below.     Please note that while we do our best to be available for urgent issues outside of office hours, we are not available 24/7.    If you have an urgent issue and are unable to reach us , you may choose to seek medical care at your doctor's office, retail clinic, urgent care center, or emergency room.   If you have a medical emergency,  please immediately call 911 or go to the emergency department. In the event of inclement weather, please call our main line at [phone] for an update on the status of any delays or closures.  Dermatology Medication Tips: Please keep the boxes that topical medications come in in order to help keep track of the instructions about where and how to use these. Pharmacies typically print the medication instructions only on the boxes and not directly on the medication tubes.   If your medication is too expensive, please contact our office at [phone] or send us  a message through  MyChart.    We are unable to tell what your co-pay for medications will be in advance as this is different depending on your insurance coverage. However, we may be able to find a substitute medication at lower cost or fill out paperwork to get insurance to cover a needed medication.    If a prior authorization is required to get your medication covered by your insurance company, please allow us  1-2 business days to complete this process.   Drug prices often vary depending on where the prescription is filled and some pharmacies may offer cheaper prices.   The website www.goodrx.com contains coupons for medications through different pharmacies. The prices here do not account for what the cost may be with help from insurance (it may be cheaper with your insurance), but the website can give you the price if you did not use any insurance.  - You can print the associated coupon and take it with your prescription to the pharmacy.  - You may also stop by our office during regular business hours and pick up a GoodRx coupon card.  - If you need your prescription sent electronically to a different pharmacy, notify our office through [facility] or by phone at [phone]

## 2024-04-27 ENCOUNTER — Ambulatory Visit: Admitting: Family Medicine

## 2024-04-27 VITALS — BP 112/72 | HR 98 | Ht 64.0 in | Wt 215.0 lb

## 2024-04-27 DIAGNOSIS — M9908 Segmental and somatic dysfunction of rib cage: Secondary | ICD-10-CM | POA: Diagnosis not present

## 2024-04-27 DIAGNOSIS — G2589 Other specified extrapyramidal and movement disorders: Secondary | ICD-10-CM | POA: Diagnosis not present

## 2024-04-27 DIAGNOSIS — M5030 Other cervical disc degeneration, unspecified cervical region: Secondary | ICD-10-CM | POA: Diagnosis not present

## 2024-04-27 DIAGNOSIS — M5412 Radiculopathy, cervical region: Secondary | ICD-10-CM

## 2024-04-27 DIAGNOSIS — M9901 Segmental and somatic dysfunction of cervical region: Secondary | ICD-10-CM

## 2024-04-27 DIAGNOSIS — M9902 Segmental and somatic dysfunction of thoracic region: Secondary | ICD-10-CM | POA: Diagnosis not present

## 2024-04-27 NOTE — Assessment & Plan Note (Signed)
 I believe patient is having more scapular dyskinesis that is secondary to muscle imbalances that is causing the cervicogenic headaches.  Causing more tightness noted.  Patient also has a child with special needs and has to do more lifting that I think and contributed to it as well.  Discussed icing regimen and home exercises.  Increase activity slowly.  We discussed ergonomics at work and may need to get her an adjustable standing desk.  Responded very well to osteopathic manipulation.  Will follow-up again in 6 to 8 weeks

## 2024-04-27 NOTE — Patient Instructions (Addendum)
 Scapular exercises Epidural C7-T1 do not schedule for 4 weeks Monitor at eye level See me in 6-8 weeks

## 2024-04-27 NOTE — Assessment & Plan Note (Signed)
 Found to have some degenerative cervical disease.  Discussed icing regimen and home exercises.  Discussed which activities to do and which ones to avoid.  We discussed that an epidural could be beneficial for diagnostic and therapeutic purposes.  Patient will consider this.  Follow-up with me again in 6 to 8 weeks otherwise.

## 2024-04-27 NOTE — Progress Notes (Signed)
 [facility] [address] [place] [address] Phone: [phone] Subjective:   Ann [name], am serving as a scribe for Dr. Ronnell Coins.  I'm seeing this patient by the request  of:  Ann Dama, NP  CC: Neck and headache  [id number]  DASIAH [name] is a 33 y.o. female coming in with complaint of headache and neck pain.  Has been seeing neurology for quite some time. Dull toothache at base of skull. Uses heat for pain relief. Sitting at a desk seems to increase her pain as well as barometric pressure changes. Has meloxicam but uses is prn. Denies any radiating symptoms. With any straining she gets a pressure or pulsating in her head that will last a short amount of time.   Reviewing chart did increase the Cymbalta to 60 mg recently.     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HCC)    Depression    Ear itch    Headache    Loose stools    Migraine    Past Surgical History:  Procedure Laterality Date   BLADDER SURGERY     CESAREAN SECTION N/A 08/20/2019   Procedure: CESAREAN SECTION;  Surgeon: Matt Song, MD;  Location: [facility] LD ORS;  Service: Obstetrics;  Laterality: N/A;   COLONOSCOPY WITH PROPOFOL  N/A 10/24/2022   Procedure: COLONOSCOPY WITH PROPOFOL ;  Surgeon: Selena Daily, MD;  Location: [facility] ENDOSCOPY;  Service: Gastroenterology;  Laterality: N/A;   FOOT SURGERY Left 2008   great toe broken, pin placed and removed    TONSILLECTOMY     At age of 9   URETERAL EXPLORATION     WISDOM TOOTH EXTRACTION     Social History   Socioeconomic History   Marital status: Single    Spouse name: Not on file   Number of children: 0    Years of education: some colle   Highest education level: Not on file  Occupational History   Occupation: Unemployed     Employer: [employer]  Tobacco Use   Smoking status: Former    Types: Cigars    Passive exposure: Current    Smokeless tobacco: Current  Vaping Use   Vaping status: Every Day   Substances: Nicotine  Substance and Sexual Activity   Alcohol use: Yes    Comment: social   Drug use: No   Sexual activity: Yes    Birth control/protection: Injection  Other Topics Concern   Not on file  Social History Narrative   Lives with husband.   Has 2 dogs.    Right handed   2 story home   Caffeine yes      Social Drivers of Health   Financial Resource Strain: Low Risk  (08/06/2019)   Overall Financial Resource Strain (CARDIA)    Difficulty of Paying Living Expenses: Not hard at all  Food Insecurity: No Food Insecurity (08/06/2019)   Hunger Vital Sign    Worried About Running Out of Food in the Last Year: Never true    Ran Out of Food in the Last Year: Never true  Transportation Needs: No Transportation Needs (08/06/2019)   PRAPARE - Administrator, Civil Service (Medical): No    Lack of Transportation (Non-Medical): No  Physical Activity: Not on file  Stress: Not on file  Social Connections: Not on file   No Known Allergies Family History  Problem Relation Age of Onset  Anxiety disorder Mother    Cancer Maternal Grandfather        lung    Cancer Paternal Grandmother 59       lung cancer    Diabetes Neg Hx    Heart disease Neg Hx     Current Outpatient Medications (Endocrine & Metabolic):    levonorgestrel (MIRENA) 20 MCG/DAY IUD, 1 each by Intrauterine route once.  Current Outpatient Medications (Cardiovascular):    spironolactone  (ALDACTONE ) 100 MG tablet, Take 1 tablet (100 mg total) by mouth daily.  Current Outpatient Medications (Respiratory):    loratadine  (CLARITIN ) 10 MG tablet, Take 10 mg by mouth daily.  Current Outpatient Medications (Analgesics):    Galcanezumab -gnlm (EMGALITY ) 120 MG/ML SOAJ, Inject 120 mg into the skin every 28 (twenty-eight) days.   meloxicam (MOBIC) 15 MG tablet, 1 tablet Orally Once a day PRN   Ubrogepant  (UBRELVY ) 100 MG TABS, Take 1  tablet (100 mg total) by mouth as needed. May repeat in 2 hours.  Maximum 2 tablets in 24 hours.  Current Outpatient Medications (Hematological):    Cyanocobalamin (VITAMIN B12) 1000 MCG TBCR, 1 tablet Orally Once a day  Current Outpatient Medications (Other):    Ascorbic Acid 500 MG CAPS, 1 capsule.   Cholecalciferol (VITAMIN D3) 25 MCG (1000 UT) CAPS, 1 capsule.   clindamycin  (CLEOCIN  T) 1 % SWAB, Apply 1 Application topically in the morning. After washing the effected areas (Face, Chest and Neck)   DULoxetine (CYMBALTA) 60 MG capsule, Take 60 mg by mouth every morning.   hydrOXYzine  (ATARAX ) 25 MG tablet, Take 25 mg by mouth as needed.   tretinoin  (RETIN-A ) 0.025 % cream, Apply topically at bedtime.   vitamin E 1000 UNIT capsule, Take 1,000 Units by mouth daily.   Reviewed prior external information including notes and imaging from  primary care provider As well as notes that were available from care everywhere and other healthcare systems.  Past medical history, social, surgical and family history all reviewed in electronic medical record.  No pertanent information unless stated regarding to the chief complaint.   Review of Systems:  No headache, visual changes, nausea, vomiting, diarrhea, constipation, dizziness, abdominal pain, skin rash, fevers, chills, night sweats, weight loss, swollen lymph nodes, body aches, joint swelling, chest pain, shortness of breath, mood changes. POSITIVE muscle aches  Objective  Blood pressure 112/72, pulse 98, height 5\' 4"  (1.626 m), weight 215 lb (97.5 kg), SpO2 99%.   General: No apparent distress alert and oriented x3 mood and affect normal, dressed appropriately.  HEENT: Pupils equal, extraocular movements intact  Respiratory: Patient's speak in full sentences and does not appear short of breath  Cardiovascular: No lower extremity edema, non tender, no erythema  Neck exam does have some limited range of motion noted in the secondary some  tightness.  Patient does have some scapular dyskinesis noted that seems to be bilateral but left greater than right.  Tightness noted in the occipital area.  Negative straight leg test noted today.  Osteopathic findings C2 flexed rotated and side bent left C4 flexed rotated and side bent left C6 flexed rotated and side bent left T3 extended rotated and side bent right inhaled third rib      Impression and Recommendations:  No problem-specific Assessment & Plan notes found for this encounter.     Decision today to treat with OMT was based on Physical Exam  After verbal consent patient was treated with HVLA, ME, FPR techniques in cervical, thoracic, rib,  areas, all  areas are chronic   Patient tolerated the procedure well with improvement in symptoms  Patient given exercises, stretches and lifestyle modifications  See medications in patient instructions if given  Patient will follow up in 4-8 weeks  The above documentation has been reviewed and is accurate and complete Jezreel Justiniano M Kaitlyn Franko, DO

## 2024-05-11 ENCOUNTER — Other Ambulatory Visit (HOSPITAL_COMMUNITY): Payer: Self-pay

## 2024-05-24 NOTE — Discharge Instructions (Signed)

## 2024-05-25 ENCOUNTER — Ambulatory Visit
Admission: RE | Admit: 2024-05-25 | Discharge: 2024-05-25 | Disposition: A | Discharge status: Home / Self Care | Source: Ambulatory Visit | Attending: Family Medicine | Admitting: Family Medicine

## 2024-05-25 DIAGNOSIS — M5412 Radiculopathy, cervical region: Secondary | ICD-10-CM

## 2024-05-25 DIAGNOSIS — M47812 Spondylosis without myelopathy or radiculopathy, cervical region: Secondary | ICD-10-CM | POA: Diagnosis not present

## 2024-05-25 MED ORDER — TRIAMCINOLONE ACETONIDE 40 MG/ML IJ SUSP (RADIOLOGY)
60.0000 mg | Freq: Once | INTRAMUSCULAR | Status: AC
  Administered 2024-05-25: 60 mg via EPIDURAL

## 2024-05-25 MED ORDER — IOPAMIDOL (ISOVUE-M 300) INJECTION 61%
1.0000 mL | Freq: Once | INTRAMUSCULAR | Status: AC
  Administered 2024-05-25: 1 mL via EPIDURAL

## 2024-05-26 ENCOUNTER — Ambulatory Visit: Admitting: Family Medicine

## 2024-06-01 DIAGNOSIS — F331 Major depressive disorder, recurrent, moderate: Secondary | ICD-10-CM | POA: Diagnosis not present

## 2024-06-01 DIAGNOSIS — F411 Generalized anxiety disorder: Secondary | ICD-10-CM | POA: Diagnosis not present

## 2024-06-03 NOTE — Progress Notes (Unsigned)
  [facility] [address] [place] [address] Phone: [phone] Subjective:    I'm seeing this patient by the request  of:  Senaida Dama, NP  CC:   [id number]  Ann Rice is a 33 y.o. female coming in with complaint of back and neck pain. OMT 04/27/2024. Patient states   Medications patient has been prescribed: None  Taking:         Reviewed prior external information including notes and imaging from previsou exam, outside providers and external EMR if available.   As well as notes that were available from care everywhere and other healthcare systems.  Past medical history, social, surgical and family history all reviewed in electronic medical record.  No pertanent information unless stated regarding to the chief complaint.   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HCC)    Depression    Ear itch    Headache    Loose stools    Migraine     No Known Allergies   Review of Systems:  No headache, visual changes, nausea, vomiting, diarrhea, constipation, dizziness, abdominal pain, skin rash, fevers, chills, night sweats, weight loss, swollen lymph nodes, body aches, joint swelling, chest pain, shortness of breath, mood changes. POSITIVE muscle aches  Objective  There were no vitals taken for this visit.   General: No apparent distress alert and oriented x3 mood and affect normal, dressed appropriately.  HEENT: Pupils equal, extraocular movements intact  Respiratory: Patient's speak in full sentences and does not appear short of breath  Cardiovascular: No lower extremity edema, non tender, no erythema  Gait MSK:  Back   Osteopathic findings  C2 flexed rotated and side bent right C6 flexed rotated and side bent left T3 extended rotated and side bent right inhaled rib T9 extended rotated and side bent left L2 flexed rotated and side bent right Sacrum right on  right       Assessment and Plan:  No problem-specific Assessment & Plan notes found for this encounter.    Nonallopathic problems  Decision today to treat with OMT was based on Physical Exam  After verbal consent patient was treated with HVLA, ME, FPR techniques in cervical, rib, thoracic, lumbar, and sacral  areas  Patient tolerated the procedure well with improvement in symptoms  Patient given exercises, stretches and lifestyle modifications  See medications in patient instructions if given  Patient will follow up in 4-8 weeks             Note: This dictation was prepared with Dragon dictation along with smaller phrase technology. Any transcriptional errors that result from this process are unintentional.

## 2024-06-09 ENCOUNTER — Encounter: Payer: Self-pay | Admitting: Family Medicine

## 2024-06-09 ENCOUNTER — Ambulatory Visit (INDEPENDENT_AMBULATORY_CARE_PROVIDER_SITE_OTHER): Admitting: Family Medicine

## 2024-06-09 VITALS — BP 118/82 | HR 97 | Ht 64.0 in | Wt 214.0 lb

## 2024-06-09 DIAGNOSIS — M9902 Segmental and somatic dysfunction of thoracic region: Secondary | ICD-10-CM | POA: Diagnosis not present

## 2024-06-09 DIAGNOSIS — M5412 Radiculopathy, cervical region: Secondary | ICD-10-CM | POA: Diagnosis not present

## 2024-06-09 DIAGNOSIS — M5030 Other cervical disc degeneration, unspecified cervical region: Secondary | ICD-10-CM

## 2024-06-09 DIAGNOSIS — M25512 Pain in left shoulder: Secondary | ICD-10-CM | POA: Diagnosis not present

## 2024-06-09 DIAGNOSIS — M9901 Segmental and somatic dysfunction of cervical region: Secondary | ICD-10-CM | POA: Diagnosis not present

## 2024-06-09 DIAGNOSIS — G2589 Other specified extrapyramidal and movement disorders: Secondary | ICD-10-CM | POA: Diagnosis not present

## 2024-06-09 DIAGNOSIS — M9908 Segmental and somatic dysfunction of rib cage: Secondary | ICD-10-CM | POA: Diagnosis not present

## 2024-06-09 NOTE — Patient Instructions (Addendum)
 Epidural C7-T1  2 month follow up

## 2024-06-09 NOTE — Assessment & Plan Note (Signed)
 Patient given trigger point injections and we will see how patient responds.  I do believe the underlying scapular dyskinesis could be potentially contributing as well.  Discussed icing regimen and home exercises, discussed with patient about different medications again.  After further evaluation and patient's other difficulties we did do osteopathic manipulation.  Responded well to that.  Follow-up again in 6 to 8 weeks otherwise.

## 2024-06-09 NOTE — Assessment & Plan Note (Signed)
 Known degenerative disc disease.  Different than the other problems.  Responded a little bit to the epidural previously but did not note any significant improvement.  We did discuss that up to 3 of these could show some benefit.  Patient would like to try it.  Follow-up again 6 to 8 weeks for further evaluation.

## 2024-06-15 ENCOUNTER — Encounter: Payer: Self-pay | Admitting: Family Medicine

## 2024-06-15 ENCOUNTER — Other Ambulatory Visit: Payer: Self-pay

## 2024-06-15 DIAGNOSIS — R0683 Snoring: Secondary | ICD-10-CM

## 2024-06-15 DIAGNOSIS — N760 Acute vaginitis: Secondary | ICD-10-CM | POA: Diagnosis not present

## 2024-06-15 DIAGNOSIS — R300 Dysuria: Secondary | ICD-10-CM | POA: Diagnosis not present

## 2024-06-20 DIAGNOSIS — J301 Allergic rhinitis due to pollen: Secondary | ICD-10-CM | POA: Diagnosis not present

## 2024-06-20 DIAGNOSIS — E669 Obesity, unspecified: Secondary | ICD-10-CM | POA: Diagnosis not present

## 2024-06-20 DIAGNOSIS — M1990 Unspecified osteoarthritis, unspecified site: Secondary | ICD-10-CM | POA: Diagnosis not present

## 2024-06-20 DIAGNOSIS — G43909 Migraine, unspecified, not intractable, without status migrainosus: Secondary | ICD-10-CM | POA: Diagnosis not present

## 2024-06-20 DIAGNOSIS — L700 Acne vulgaris: Secondary | ICD-10-CM | POA: Diagnosis not present

## 2024-06-20 DIAGNOSIS — Z811 Family history of alcohol abuse and dependence: Secondary | ICD-10-CM | POA: Diagnosis not present

## 2024-06-20 DIAGNOSIS — F411 Generalized anxiety disorder: Secondary | ICD-10-CM | POA: Diagnosis not present

## 2024-06-20 DIAGNOSIS — F17290 Nicotine dependence, other tobacco product, uncomplicated: Secondary | ICD-10-CM | POA: Diagnosis not present

## 2024-06-20 DIAGNOSIS — K219 Gastro-esophageal reflux disease without esophagitis: Secondary | ICD-10-CM | POA: Diagnosis not present

## 2024-06-20 DIAGNOSIS — Z6836 Body mass index (BMI) 36.0-36.9, adult: Secondary | ICD-10-CM | POA: Diagnosis not present

## 2024-06-20 DIAGNOSIS — F325 Major depressive disorder, single episode, in full remission: Secondary | ICD-10-CM | POA: Diagnosis not present

## 2024-06-20 DIAGNOSIS — Z818 Family history of other mental and behavioral disorders: Secondary | ICD-10-CM | POA: Diagnosis not present

## 2024-06-28 ENCOUNTER — Ambulatory Visit (INDEPENDENT_AMBULATORY_CARE_PROVIDER_SITE_OTHER): Admitting: Dermatology

## 2024-06-28 ENCOUNTER — Encounter: Payer: Self-pay | Admitting: Dermatology

## 2024-06-28 VITALS — BP 113/84 | HR 100

## 2024-06-28 DIAGNOSIS — L709 Acne, unspecified: Secondary | ICD-10-CM | POA: Diagnosis not present

## 2024-06-28 DIAGNOSIS — L700 Acne vulgaris: Secondary | ICD-10-CM

## 2024-06-28 DIAGNOSIS — L743 Miliaria, unspecified: Secondary | ICD-10-CM

## 2024-06-28 NOTE — Patient Instructions (Addendum)
 Date: Mon Jun 28 2024  Hello Ann Rice,  Thank you for visiting today. Here is a summary of the key instructions:  Medications: - Continue taking spironolactone  as prescribed - Use tretinoin  cream 2-3 nights per week on face - Apply tretinoin  cream 2 nights per week on arms for heat bumps - Use clindamycin  in the morning  Skin Care: - Keep using LaRoche face wash - Use light moisturizers and lotions, not creams - Avoid all oil-based products on face - For heat bumps:   - Use hydrocortisone for 2-3 days if itchy   - Use light moisturizers when sweating  Reminders: - Set an alarm on phone for tretinoin  application - Write a reminder on bathroom mirror - Leave tretinoin  tube out on sink  Follow-up: - Skin check appointment next week  Please reach out if you have any questions or concerns.  Warm regards,  Dr. [facility]      Important Information   Due to recent changes in healthcare laws, you may see results of your pathology and/or laboratory studies on MyChart before the doctors have had a chance to review them. We understand that in some cases there may be results that are confusing or concerning to you. Please understand that not all results are received at the same time and often the doctors may need to interpret multiple results in order to provide you with the best plan of care or course of treatment. Therefore, we ask that you please give us  2 business days to thoroughly review all your results before contacting the office for clarification. Should we see a critical lab result, you will be contacted sooner.     If You Need Anything After Your Visit   If you have any questions or concerns for your doctor, please call our main line at [phone]. If no one answers, please leave a voicemail as directed and we will return your call as soon as possible. Messages left after 4 pm will be answered the following business day.    You may also send us  a  message via MyChart. We typically respond to MyChart messages within 1-2 business days.  For prescription refills, please ask your pharmacy to contact our office. Our fax number is [phone].  If you have an urgent issue when the clinic is closed that cannot wait until the next business day, you can page your doctor at the number below.     Please note that while we do our best to be available for urgent issues outside of office hours, we are not available 24/7.    If you have an urgent issue and are unable to reach us , you may choose to seek medical care at your doctor's office, retail clinic, urgent care center, or emergency room.   If you have a medical emergency, please immediately call 911 or go to the emergency department. In the event of inclement weather, please call our main line at [phone] for an update on the status of any delays or closures.  Dermatology Medication Tips: Please keep the boxes that topical medications come in in order to help keep track of the instructions about where and how to use these. Pharmacies typically print the medication instructions only on the boxes and not directly on the medication tubes.   If your medication is too expensive, please contact our office at [phone] or send us  a message through MyChart.    We are unable to tell what your co-pay for medications will be  in advance as this is different depending on your insurance coverage. However, we may be able to find a substitute medication at lower cost or fill out paperwork to get insurance to cover a needed medication.    If a prior authorization is required to get your medication covered by your insurance company, please allow us  1-2 business days to complete this process.   Drug prices often vary depending on where the prescription is filled and some pharmacies may offer cheaper prices.   The website www.goodrx.com contains coupons for medications through different pharmacies. The  prices here do not account for what the cost may be with help from insurance (it may be cheaper with your insurance), but the website can give you the price if you did not use any insurance.  - You can print the associated coupon and take it with your prescription to the pharmacy.  - You may also stop by our office during regular business hours and pick up a GoodRx coupon card.  - If you need your prescription sent electronically to a different pharmacy, notify our office through [facility] or by phone at [phone]

## 2024-06-28 NOTE — Discharge Instructions (Signed)

## 2024-06-28 NOTE — Progress Notes (Signed)
   Follow-Up Visit   Subjective  Ann Rice is a 33 y.o. female who presents for the following: Acne  Patient present today for follow up visit for Acne. Patient was last evaluated on 04/21/24. At this visit patient was prescribed Clindamycin  Swabs, spironolactone  and tretinoin  0.025%. Patient report she has not been consistent with her regimen. She states she has only used tretinoin  once since her previous visit. She has taken the spironolactone  which she feels worked very well for her.  She reports her sxs are Improving. Patient denies medication changes.  The following portions of the chart were reviewed this encounter and updated as appropriate: medications, allergies, medical history  Review of Systems:  No other skin or systemic complaints except as noted in HPI or Assessment and Plan.  Objective  Well appearing patient in no apparent distress; mood and affect are within normal limits.  A focused examination was performed of the following areas: Face  Relevant exam findings are noted in the Assessment and Plan.           Assessment & Plan    1. Acne - Assessment: Patient has generally good skin with occasional acne flares. On current regimen since May, including oral spironolactone  and topical treatments. Reports difficulty adhering to topical cream routine. Still experiences small flares despite never having severe acne. Tretinoin  cream identified as most crucial component for clear skin and flare prevention.  - Plan:    Continue spironolactone  (current dose)    Tretinoin  cream:     - Apply 2-3 nights per week     - Emphasis on consistent nighttime application for optimal results    Clindamycin :     - Apply in the morning     - Noted as important but secondary to tretinoin  in efficacy    Patient education:     - Discussed importance of tretinoin  for skin clearing and texture improvement     - Provided adherence strategies: setting phone alarms, using mirror  markers, leaving the tube visible    Follow-up:     - Scheduled skin check next week  2. Miliaria (Heat Bumps) - Assessment: Patient reports experiencing heat bumps, exacerbated by heavy moisturizers, oily products, or excessive sweating. Can be improved with proper skincare routine adjustments and targeted treatment.  - Plan:    Tretinoin  application:     - Apply to arms 2 nights per week     - Mix pea-sized amount with pump of lotion before application    For itchy bumps:     - Use hydrocortisone for 2-3 days    Skincare recommendations:     - Use light moisturizers and lotions instead of creams     - Avoid oil-based products on face    Patient education:     - Explained mechanism of tretinoin  in improving skin cell turnover     - Advised on sun sensitivity with tretinoin  use    Provide LaRoche Posay sunscreen for summer use    Return in about 4 months (around 10/29/2024) for Acne F/U.  I, Jetta Ager, am acting as Neurosurgeon for [employer], DO.  Documentation: I have reviewed the above documentation for accuracy and completeness, and I agree with the above.  Delon Lenis, DO

## 2024-06-29 ENCOUNTER — Ambulatory Visit
Admission: RE | Admit: 2024-06-29 | Discharge: 2024-06-29 | Disposition: A | Discharge status: Home / Self Care | Source: Ambulatory Visit | Attending: Family Medicine | Admitting: Family Medicine

## 2024-06-29 ENCOUNTER — Encounter: Payer: Self-pay | Admitting: Sleep Medicine

## 2024-06-29 ENCOUNTER — Ambulatory Visit (INDEPENDENT_AMBULATORY_CARE_PROVIDER_SITE_OTHER): Admitting: Sleep Medicine

## 2024-06-29 VITALS — BP 110/70 | HR 91 | Temp 98.4°F | Ht 64.0 in | Wt 218.0 lb

## 2024-06-29 DIAGNOSIS — M9908 Segmental and somatic dysfunction of rib cage: Secondary | ICD-10-CM

## 2024-06-29 DIAGNOSIS — F411 Generalized anxiety disorder: Secondary | ICD-10-CM

## 2024-06-29 DIAGNOSIS — M5030 Other cervical disc degeneration, unspecified cervical region: Secondary | ICD-10-CM

## 2024-06-29 DIAGNOSIS — G4733 Obstructive sleep apnea (adult) (pediatric): Secondary | ICD-10-CM

## 2024-06-29 DIAGNOSIS — M9902 Segmental and somatic dysfunction of thoracic region: Secondary | ICD-10-CM

## 2024-06-29 DIAGNOSIS — M47812 Spondylosis without myelopathy or radiculopathy, cervical region: Secondary | ICD-10-CM | POA: Diagnosis not present

## 2024-06-29 DIAGNOSIS — G2589 Other specified extrapyramidal and movement disorders: Secondary | ICD-10-CM

## 2024-06-29 DIAGNOSIS — M9901 Segmental and somatic dysfunction of cervical region: Secondary | ICD-10-CM

## 2024-06-29 DIAGNOSIS — M5412 Radiculopathy, cervical region: Secondary | ICD-10-CM

## 2024-06-29 MED ORDER — TRIAMCINOLONE ACETONIDE 40 MG/ML IJ SUSP (RADIOLOGY)
60.0000 mg | Freq: Once | INTRAMUSCULAR | Status: AC
  Administered 2024-06-29: 60 mg via EPIDURAL

## 2024-06-29 MED ORDER — IOPAMIDOL (ISOVUE-M 300) INJECTION 61%
1.0000 mL | Freq: Once | INTRAMUSCULAR | Status: AC | PRN
  Administered 2024-06-29: 1 mL via EPIDURAL

## 2024-06-29 NOTE — Patient Instructions (Signed)
 Ann Rice

## 2024-06-29 NOTE — Progress Notes (Signed)
 Name:Ann Rice MRN: [id number] DOB: 01-Apr-1991   CHIEF COMPLAINT:  EXCESSIVE DAYTIME SLEEPINESS   HISTORY OF PRESENT ILLNESS:  Ann Rice is a 33 y.o. w/ a h/o migraine disorder, depression, anxiety and obesity who presents for c/o loud snoring and excessive daytime sleepiness which has been present for several years. Reports nocturnal awakenings due to nocturia, however does not have difficulty falling back to sleep. Reports a 40-50 lb weight gain over the last few years. Admits to dry mouth, night sweats and morning headaches. Denies RLS symptoms, dream enactment, cataplexy, hypnagogic or hypnapompic hallucinations. Denies a family history of sleep apnea. Reports drowsy driving. Drinks 2-3 cups of soda or tea daily, denies alcohol or illicit drug use. Vapes throughout the day.  Bedtime 9-11 pm Sleep onset 10 mins Rise time 7:30-9 am   EPWORTH SLEEP SCORE 12    06/29/2024    2:00 PM  Results of the Epworth flowsheet  Sitting and reading 2  Watching TV 1  Sitting, inactive in a public place (e.g. a theatre or a meeting) 0  As a passenger in a car for an hour without a break 3  Lying down to rest in the afternoon when circumstances permit 3  Sitting and talking to someone 0  Sitting quietly after a lunch without alcohol 3  In a car, while stopped for a few minutes in traffic 0  Total score 12    PAST MEDICAL HISTORY :   has a past medical history of Anxiety, Arthritis (2024), 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HCC), Depression, Ear itch, GERD (gastroesophageal reflux disease), Headache, Loose stools, and Migraine.  has a past surgical history that includes Tonsillectomy; Foot surgery (Left, 2008); Bladder surgery; Wisdom tooth extraction; Cesarean section (N/A, 08/20/2019); Ureteral exploration; Colonoscopy with propofol  (N/A, 10/24/2022); and Fracture surgery (2008). Prior to Admission medications   Medication Sig Start Date End Date Taking? Authorizing Provider   Ascorbic Acid 500 MG CAPS 1 capsule.   Yes [provider]  Cholecalciferol (VITAMIN D3) 25 MCG (1000 UT) CAPS 1 capsule. 03/11/24  Yes [provider]  clindamycin  (CLEOCIN  T) 1 % SWAB Apply 1 Application topically in the morning. After washing the effected areas (Face, Chest and Neck) 04/21/24  Yes Alm Delon SAILOR, DO  Cyanocobalamin (VITAMIN B12) 1000 MCG TBCR 1 tablet Orally Once a day   Yes [provider]  DULoxetine (CYMBALTA) 60 MG capsule Take 60 mg by mouth every morning.   Yes [provider]  fluconazole (DIFLUCAN) 150 MG tablet Take 150 mg by mouth every 3 (three) days.   Yes [provider]  Galcanezumab -gnlm (EMGALITY ) 120 MG/ML SOAJ Inject 120 mg into the skin every 28 (twenty-eight) days. 04/20/24  Yes Jaffe, Adam R, DO  hydrOXYzine  (ATARAX ) 25 MG tablet Take 25 mg by mouth as needed.   Yes [provider]  ketoconazole  (NIZORAL ) 2 % shampoo Apply 1 Application topically 2 (two) times a week.   Yes [provider]  levonorgestrel (MIRENA) 20 MCG/DAY IUD 1 each by Intrauterine route once.   Yes [provider]  loratadine  (CLARITIN ) 10 MG tablet Take 10 mg by mouth daily. 03/11/24  Yes [provider]  meloxicam (MOBIC) 15 MG tablet 1 tablet Orally Once a day PRN 03/11/24  Yes [provider]  spironolactone  (ALDACTONE ) 100 MG tablet Take 1 tablet (100 mg total) by mouth daily. 04/21/24  Yes Alm Delon SAILOR, DO  tretinoin  (RETIN-A ) 0.025 % cream Apply topically at bedtime. 04/21/24  04/21/25 Yes Alm Delon SAILOR, DO  Ubrogepant  (UBRELVY ) 100 MG TABS Take 1 tablet (100 mg total) by mouth as needed. May repeat in 2 hours.  Maximum 2 tablets in 24 hours. 04/20/24  Yes Jaffe, Adam R, DO  vitamin E 1000 UNIT capsule Take 1,000 Units by mouth daily.   Yes [provider]   No Known Allergies  FAMILY HISTORY:  family history includes Alcohol abuse in her father and maternal uncle; Anemia in her  mother; Anxiety disorder in her mother; Arthritis in her maternal grandmother; Cancer in her maternal grandfather and paternal uncle; Cancer (age of onset: 67) in her paternal grandmother; Depression in her mother; Drug abuse in her maternal aunt and maternal uncle; Hearing loss in her paternal grandfather and paternal uncle; Intellectual disability in her son. SOCIAL HISTORY:  reports that she has quit smoking. Her smoking use included cigars. She has been exposed to tobacco smoke. She uses smokeless tobacco. She reports current alcohol use. She reports that she does not use drugs.   Review of Systems:  Gen:  Denies  fever, sweats, chills weight loss  HEENT: Denies blurred vision, double vision, ear pain, eye pain, hearing loss, nose bleeds, sore throat Cardiac:  No dizziness, chest pain or heaviness, chest tightness,edema, No JVD Resp:   No cough, -sputum production, -shortness of breath,-wheezing, -hemoptysis,  Gi: Denies swallowing difficulty, stomach pain, nausea or vomiting, diarrhea, constipation, bowel incontinence Gu:  Denies bladder incontinence, burning urine Ext:   Denies Joint pain, stiffness or swelling Skin: Denies  skin rash, easy bruising or bleeding or hives Endoc:  Denies polyuria, polydipsia , polyphagia or weight change Psych:   Denies depression, insomnia or hallucinations  Other:  All other systems negative  VITAL SIGNS: BP 110/70 (BP Location: Left Arm, Patient Position: Sitting, Cuff Size: Large)   Pulse 91   Temp 98.4 F (36.9 C) (Oral)   Ht 5' 4 (1.626 m)   Wt 218 lb (98.9 kg)   SpO2 97%   BMI 37.42 kg/m    Physical Examination:   General Appearance: No distress  EYES PERRLA, EOM intact.   NECK Supple, No JVD Pulmonary: normal breath sounds, No wheezing.  CardiovascularNormal S1,S2.  No m/r/g.   Abdomen: Benign, Soft, non-tender. Skin:   warm, no rashes, no ecchymosis  Extremities: normal, no cyanosis, clubbing. Neuro:without focal findings,   speech normal  PSYCHIATRIC: Mood, affect within normal limits.   ASSESSMENT AND PLAN  OSA I suspect that OSA is likely present due to clinical presentation. Discussed the consequences of untreated sleep apnea. Advised not to drive drowsy for safety of patient and others. Will complete further evaluation with a home sleep study and follow up to review results.    Anxiety Stable, on current management. Following with PCP.    MEDICATION ADJUSTMENTS/LABS AND TESTS ORDERED: Recommend Sleep Study   Patient  satisfied with Plan of action and management. All questions answered  Follow up to review HST results and treatment plan.   I spent a total of 53 minutes reviewing chart data, face-to-face evaluation with the patient, counseling and coordination of care as detailed above.    Kamren Heskett, M.D.  Sleep Medicine [facility]

## 2024-06-30 ENCOUNTER — Encounter: Payer: Self-pay | Admitting: Sleep Medicine

## 2024-07-02 NOTE — Telephone Encounter (Signed)
 Order was placed in [facility] on 06/29/24

## 2024-07-07 ENCOUNTER — Ambulatory Visit: Admitting: Dermatology

## 2024-07-07 ENCOUNTER — Encounter: Payer: Self-pay | Admitting: Dermatology

## 2024-07-07 VITALS — BP 149/105

## 2024-07-07 DIAGNOSIS — L814 Other melanin hyperpigmentation: Secondary | ICD-10-CM

## 2024-07-07 DIAGNOSIS — D1801 Hemangioma of skin and subcutaneous tissue: Secondary | ICD-10-CM

## 2024-07-07 DIAGNOSIS — W908XXA Exposure to other nonionizing radiation, initial encounter: Secondary | ICD-10-CM

## 2024-07-07 DIAGNOSIS — D225 Melanocytic nevi of trunk: Secondary | ICD-10-CM

## 2024-07-07 DIAGNOSIS — D229 Melanocytic nevi, unspecified: Secondary | ICD-10-CM

## 2024-07-07 DIAGNOSIS — L578 Other skin changes due to chronic exposure to nonionizing radiation: Secondary | ICD-10-CM | POA: Diagnosis not present

## 2024-07-07 DIAGNOSIS — D485 Neoplasm of uncertain behavior of skin: Secondary | ICD-10-CM | POA: Diagnosis not present

## 2024-07-07 DIAGNOSIS — Z1283 Encounter for screening for malignant neoplasm of skin: Secondary | ICD-10-CM | POA: Diagnosis not present

## 2024-07-07 DIAGNOSIS — L821 Other seborrheic keratosis: Secondary | ICD-10-CM

## 2024-07-07 NOTE — Progress Notes (Signed)
   Total Body Skin Exam (TBSE) Visit   Subjective  Ann Rice is a 33 y.o. female ESTABLISHED PATIENT who presents for the following:  Total Body Skin Exam (TBSE)  Patient does have spots of concern to be evaluated. She does apply sunscreen and/or wears protective coverings. Denied Hx of Bx - benign. Reports family Hx of skin cancers(maternal grandmother - BCC).   The patient has spots, moles and lesions to be evaluated, some may be new or changing and the patient has concerns that these could be cancer.  The following portions of the chart were reviewed this encounter and updated as appropriate: medications, allergies, medical history  Review of Systems:  No other skin or systemic complaints except as noted in HPI or Assessment and Plan.  Objective  Well appearing patient in no apparent distress; mood and affect are within normal limits.  A full examination was performed including scalp, head, eyes, ears, nose, lips, neck, chest, axillae, abdomen, back, buttocks, bilateral upper extremities, bilateral lower extremities, hands, feet, fingers, toes, fingernails, and toenails. All findings within normal limits unless otherwise noted below.   Relevant physical exam findings are noted in the Assessment and Plan.  Left Upper Back 58mmx8mm irregular brown macule Right Abdomen (side) - Lower 1.5x63mm irregular brown macule  Assessment & Plan   LENTIGINES, SEBORRHEIC KERATOSES, HEMANGIOMAS - Benign normal skin lesions - Benign-appearing - Call for any changes  BENIGN MELANOCYTIC NEVI - Tan-brown and/or pink-flesh-colored symmetric macules and papules - Benign appearing on exam today - Observation - Call clinic for new or changing moles - Recommend daily use of broad spectrum spf 30+ sunscreen to sun-exposed areas.   MILD ACTINIC DAMAGE - Chronic condition, secondary to cumulative UV/sun exposure - diffuse scaly erythematous macules with underlying dyspigmentation - Recommend  daily broad spectrum sunscreen SPF 30+ to sun-exposed areas, reapply every 2 hours as needed.  - Staying in the shade or wearing long sleeves, sun glasses (UVA+UVB protection) and wide brim hats (4-inch brim around the entire circumference of the hat) are also recommended for sun protection.  - Call for new or changing lesions.   SKIN CANCER SCREENING PERFORMED TODAY.  NEOPLASM OF UNCERTAIN BEHAVIOR OF SKIN (2) Left Upper Back Skin / nail biopsy Type of biopsy: tangential   Informed consent: discussed and consent obtained   Timeout: patient name, date of birth, surgical site, and procedure verified   Procedure prep:  Patient was prepped and draped in usual sterile fashion Prep type:  Isopropyl alcohol Anesthesia: the lesion was anesthetized in a standard fashion   Anesthetic:  1% lidocaine  w/ epinephrine  1-100,000 buffered w/ 8.4% NaHCO3 Instrument used: DermaBlade   Hemostasis achieved with: aluminum chloride   Outcome: patient tolerated procedure well   Post-procedure details: sterile dressing applied and wound care instructions given   Dressing type: petrolatum gauze and bandage    Specimen 1 - Surgical pathology Differential Diagnosis: r/o DN  Check Margins: yes Right Abdomen (side) - Lower Skin / nail biopsy  Skin / nail biopsy Return in about 1 year (around 07/07/2025) for TBSE.   Documentation: I have reviewed the above documentation for accuracy and completeness, and I agree with the above.  I, Shirron Maranda, CMA, am acting as scribe for [employer], DO.   Delon Lenis, DO

## 2024-07-07 NOTE — Patient Instructions (Addendum)

## 2024-07-08 LAB — SURGICAL PATHOLOGY

## 2024-07-13 ENCOUNTER — Ambulatory Visit: Payer: Self-pay | Admitting: Dermatology

## 2024-07-13 DIAGNOSIS — F331 Major depressive disorder, recurrent, moderate: Secondary | ICD-10-CM | POA: Diagnosis not present

## 2024-07-13 DIAGNOSIS — F411 Generalized anxiety disorder: Secondary | ICD-10-CM | POA: Diagnosis not present

## 2024-07-28 ENCOUNTER — Encounter

## 2024-07-28 DIAGNOSIS — G4733 Obstructive sleep apnea (adult) (pediatric): Secondary | ICD-10-CM

## 2024-08-04 NOTE — Progress Notes (Deleted)
  [name] [address] [place] [address] Phone: [phone] Subjective:    I'm seeing this patient by the request  of:  Lorren Greig PARAS, NP  CC: back and neck pain follow up   YEP:Ann Rice  Ann Rice is a 33 y.o. female coming in with complaint of back and neck pain. OMT 06/09/2024. Patient states   Medications patient has been prescribed: None  Taking:         Reviewed prior external information including notes and imaging from previsou exam, outside providers and external EMR if available.   As well as notes that were available from care everywhere and other healthcare systems.  Past medical history, social, surgical and family history all reviewed in electronic medical record.  No pertanent information unless stated regarding to the chief complaint.   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HCC)    Depression    Ear itch    GERD (gastroesophageal reflux disease)    Headache    Loose stools    Migraine     No Known Allergies   Review of Systems:  No headache, visual changes, nausea, vomiting, diarrhea, constipation, dizziness, abdominal pain, skin rash, fevers, chills, night sweats, weight loss, swollen lymph nodes, body aches, joint swelling, chest pain, shortness of breath, mood changes. POSITIVE muscle aches  Objective  There were no vitals taken for this visit.   General: No apparent distress alert and oriented x3 mood and affect normal, dressed appropriately.  HEENT: Pupils equal, extraocular movements intact  Respiratory: Patient's speak in full sentences and does not appear short of breath  Cardiovascular: No lower extremity edema, non tender, no erythema  Gait MSK:  Back   Osteopathic findings  C3 flexed rotated and side bent right C6 flexed rotated and side bent left T3 extended rotated and side bent right inhaled rib T9  extended rotated and side bent left L2 flexed rotated and side bent right Sacrum right on right     Assessment and Plan:  No problem-specific Assessment & Plan notes found for this encounter.    Nonallopathic problems  Decision today to treat with OMT was based on Physical Exam  After verbal consent patient was treated with HVLA, ME, FPR techniques in cervical, rib, thoracic, lumbar, and sacral  areas  Patient tolerated the procedure well with improvement in symptoms  Patient given exercises, stretches and lifestyle modifications  See medications in patient instructions if given  Patient will follow up in 4-8 weeks     The above documentation has been reviewed and is accurate and complete Azar [place] M Ezekiel Menzer, DO         Note: This dictation was prepared with Dragon dictation along with smaller phrase technology. Any transcriptional errors that result from this process are unintentional.

## 2024-08-05 ENCOUNTER — Ambulatory Visit: Admitting: Family Medicine

## 2024-08-07 ENCOUNTER — Other Ambulatory Visit: Payer: Self-pay | Admitting: Medical Genetics

## 2024-08-10 ENCOUNTER — Encounter: Payer: Self-pay | Admitting: Dermatology

## 2024-08-10 ENCOUNTER — Other Ambulatory Visit (HOSPITAL_COMMUNITY): Payer: Self-pay

## 2024-08-10 NOTE — Telephone Encounter (Signed)
 I can't diagnose the exact cause with limited information.  Pt can try OTC hydrocortisone twice a day for 2 weeks and if that doesn't clear it then schedule a follow up appointment.  Thanks

## 2024-08-11 ENCOUNTER — Telehealth: Payer: Self-pay | Admitting: Pharmacy Technician

## 2024-08-11 ENCOUNTER — Encounter: Payer: Self-pay | Admitting: Neurology

## 2024-08-11 ENCOUNTER — Other Ambulatory Visit (HOSPITAL_COMMUNITY): Payer: Self-pay

## 2024-08-11 ENCOUNTER — Telehealth (HOSPITAL_COMMUNITY): Payer: Self-pay

## 2024-08-11 DIAGNOSIS — R069 Unspecified abnormalities of breathing: Secondary | ICD-10-CM | POA: Diagnosis not present

## 2024-08-11 NOTE — Telephone Encounter (Signed)
 PA has been submitted, and telephone encounter has been created. Please see telephone encounter dated 8.27.25.

## 2024-08-11 NOTE — Telephone Encounter (Signed)
 Pharmacy Patient Advocate Encounter   Received notification from [employer] that prior authorization for EMGALITY  120MG  is required/requested.   Insurance verification completed.   The patient is insured through [employer] .   Per test claim: PA required; PA submitted to above mentioned insurance via [employer] Key/confirmation #/EOC [id number] Status is pending

## 2024-08-12 ENCOUNTER — Other Ambulatory Visit (HOSPITAL_COMMUNITY): Payer: Self-pay

## 2024-08-12 ENCOUNTER — Ambulatory Visit: Payer: Self-pay

## 2024-08-12 ENCOUNTER — Telehealth: Payer: Self-pay

## 2024-08-12 DIAGNOSIS — G4733 Obstructive sleep apnea (adult) (pediatric): Secondary | ICD-10-CM

## 2024-08-12 NOTE — Telephone Encounter (Signed)
 Appt canceled and CPAP order placed.

## 2024-08-12 NOTE — Telephone Encounter (Signed)
 Pharmacy Patient Advocate Encounter   Received notification from Patient Advice Request messages that prior authorization for Ubrelvy  100MG  tablets is required/requested.   Insurance verification completed.   The patient is insured through [employer] .   Per test claim: PA required; PA submitted to above mentioned insurance via [employer] Key/confirmation #/EOC [id number] Status is pending

## 2024-08-13 ENCOUNTER — Ambulatory Visit: Admitting: Sleep Medicine

## 2024-08-13 NOTE — Telephone Encounter (Signed)
 Pharmacy Patient Advocate Encounter  Received notification from [employer] that Prior Authorization for Ubrelvy  100MG  tablets has been APPROVED from 08-12-2024 to 08-12-2025   PA #/Case ID/Reference #: [id number]

## 2024-08-13 NOTE — Progress Notes (Signed)
 [facility] [address] [place] [address] Phone: [phone] Subjective:   Ann Rice am a scribe for Dr. Claudene.   I'Ann seeing this patient by the request  of:  Lorren Greig PARAS, NP  CC: Neck pain follow-up  YEP:Dlagzrupcz   06/09/24  Known degenerative disc disease.  Different than the other problems.  Responded a little bit to the epidural previously but did not note any significant improvement.  We did discuss that up to 3 of these could show some benefit.  Patient would like to try it.       Patient given trigger point injections and we will see how patient responds.  I do believe the underlying scapular dyskinesis could be potentially contributing as well.  Discussed icing regimen and home exercises, discussed with patient about different medications again.  After further evaluation and patient's other difficulties we did do osteopathic manipulation.  Responded well to that.  Follow-up again in 6 to 8 weeks otherwise.    Ann Rice is a 33 y.o. female coming in with complaint of back and neck pain. Patient states back and neck are pretty good today.  He does have some tightness noted.  States that the injection helped out significantly.  Soreness from potentially the injection.  No radicular symptoms.  Has had days where she would have this been able to forget about it.      Reviewed prior external information including notes and imaging from previsou exam, outside providers and external EMR if available.   As well as notes that were available from care everywhere and other healthcare systems.  Past medical history, social, surgical and family history all reviewed in electronic medical record.  No pertanent information unless stated regarding to the chief complaint.   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 POSITIVE muscle aches  Objective  Blood pressure 110/70, pulse 91, height 5' 4 (1.626 Ann), weight 221 lb 6.4 oz (100.4 kg), SpO2 97%.   General: No apparent distress alert and oriented x3 mood and affect normal, dressed appropriately.  HEENT: Pupils equal, extraocular movements intact  Respiratory: Patient's speak in full sentences and does not appear short of breath  Cardiovascular: No lower extremity edema, non tender, no erythema  Gait MSK:  Back does have some mild loss lordosis.  Neck exam still has some limited sidebending bilaterally left greater than right.  Osteopathic findings  C2 flexed rotated and side bent right C6 flexed rotated and side bent left T3 extended rotated and side bent right inhaled rib T6 extended rotated and side bent left L1 flexed rotated and side bent right Sacrum right on right       Assessment and Plan:  Degenerative cervical disc Attempted muscle energy.  Hopefully this will be beneficial.  Patient did do well with the epidural as well.  Discussed icing regimen and home exercises, increase activity slowly.  Discussed icing regimen.  Follow-up again in  6 to 8 weeks    Nonallopathic problems  Decision today to treat with OMT was based on Physical Exam  After verbal consent patient was treated with HVLA, ME, FPR techniques in cervical, rib, thoracic, lumbar, and sacral  areas avoided HVLA on the cervical spine  Patient tolerated the procedure well with improvement in symptoms  Patient given exercises, stretches and lifestyle modifications  See medications in patient  instructions if given  Patient will follow up in 4-8 weeks     The above documentation has been reviewed and is accurate and complete Ann Rice Ann Trinitee Horgan, DO         Note: This dictation was prepared with Dragon dictation along with smaller phrase technology. Any transcriptional errors that result from this process are unintentional.

## 2024-08-17 ENCOUNTER — Encounter: Payer: Self-pay | Admitting: Family Medicine

## 2024-08-17 ENCOUNTER — Ambulatory Visit: Admitting: Family Medicine

## 2024-08-17 VITALS — BP 110/70 | HR 91 | Ht 64.0 in | Wt 221.4 lb

## 2024-08-17 DIAGNOSIS — M9904 Segmental and somatic dysfunction of sacral region: Secondary | ICD-10-CM | POA: Diagnosis not present

## 2024-08-17 DIAGNOSIS — M9903 Segmental and somatic dysfunction of lumbar region: Secondary | ICD-10-CM

## 2024-08-17 DIAGNOSIS — M9901 Segmental and somatic dysfunction of cervical region: Secondary | ICD-10-CM

## 2024-08-17 DIAGNOSIS — M5030 Other cervical disc degeneration, unspecified cervical region: Secondary | ICD-10-CM | POA: Diagnosis not present

## 2024-08-17 DIAGNOSIS — M9902 Segmental and somatic dysfunction of thoracic region: Secondary | ICD-10-CM

## 2024-08-17 DIAGNOSIS — M9908 Segmental and somatic dysfunction of rib cage: Secondary | ICD-10-CM | POA: Diagnosis not present

## 2024-08-17 NOTE — Patient Instructions (Signed)
 Good to see you. Glad your are doing great.  Return in 2 months.

## 2024-08-17 NOTE — Assessment & Plan Note (Signed)
 Attempted muscle energy.  Hopefully this will be beneficial.  Patient did do well with the epidural as well.  Discussed icing regimen and home exercises, increase activity slowly.  Discussed icing regimen.  Follow-up again in 6 to 8 weeks

## 2024-08-17 NOTE — Telephone Encounter (Signed)
 Pharmacy Patient Advocate Encounter  Received notification from [employer] that Prior Authorization for EMGALITY  120MG  has been APPROVED from 08-11-2024 to 08-11-2025   PA #/Case ID/Reference #: [id number]

## 2024-08-18 ENCOUNTER — Other Ambulatory Visit: Payer: Self-pay

## 2024-08-19 NOTE — Telephone Encounter (Signed)
 Sleep study was completed on 07/28/24

## 2024-08-26 ENCOUNTER — Encounter: Payer: Self-pay | Admitting: Neurology

## 2024-08-29 NOTE — Progress Notes (Signed)
 Virtual Visit via Video Note  Consent was obtained for video visit:  Yes.   Answered questions that patient had about telehealth interaction:  Yes.   I discussed the limitations, risks, security and privacy concerns of performing an evaluation and management service by telemedicine. I also discussed with the patient that there may be a patient responsible charge related to this service. The patient expressed understanding and agreed to proceed.  Pt location: Home Physician Location: office Name of referring provider:  Lorren Greig PARAS, NP I connected with Ann Rice at patients initiation/request on 08/30/2024 at  8:50 AM EDT by video enabled telemedicine application and verified that I am speaking with the correct person using two identifiers. Pt MRN:  [id number] Pt DOB:  October 07, 1991 Video Participants:  Ann Rice  Assessment/Plan:   New headache likely migraine triggered by delay in getting Emgality .  No red flags.   Tension type headache Migraine without aura, without status migrainosus, not intractable Cervicalgia   Headache prevention: Emgality  Migraine rescue:  Ubrelvy  100mg  Seeing Dr. Claudene for neck pain. Limit use of pain relievers to no more than 9 days out of the month to prevent risk of rebound or medication-overuse headache. Keep headache diary Follow up on 11/5.       Subjective:  Ann Rice is a 33 year old right-handed female with psoriasis who follows up for new headache.   UPDATE: He has been seeing Dr. Claudene at [facility].  He received 2 cervical epidurals.  The second one was helpful.  Thursday woke up with new type of headache, a 9/10 right temporal throbbing headache.  She had associated photophobia and phonophobia.  No nausea but no appetite.  No visual disturbance.  Rested once of the day.  Tried to stay hydrated.  Ubrelvy  was ineffective.  Pain aggravated with changing positions or standing up.  One time when she stood up, she lost  her balance. Didn't fall.  When she woke up on Friday, she felt better.  On Saturday, it returned a couple of hours after waking up in the morning but this time responded to Ubrelvy . She says she was a week late taking her Emgality  because delay due to insurance authorization.    She had been waking up with dull headaches but was diagnosed recently with sleep apnea.  Still waiting on insurance to approve a CPAP.       Current NSAIDS/analgesics: meloxicam (for arthritis in neck - rarely takes) Current triptans:  none Current ergotamine:  none Current anti-emetic:  none Current muscle relaxants:  tizanidine  4mg  at bedtime Current Antihypertensive medications:  none Current Antidepressant medications:  duloxetine 30mg  daily Current Anticonvulsant medications:  none Current anti-CGRP:  Emgality , Ubrelvy  100mg   Current Antihistamines/Decongestants:  Xyzal Other therapy:  heating pad to the neck Hormone/birth control:  Mirena Other medications:  Latuda    Drinks 1 iced coffee in morning and sweet tea throughout the day.  Does not drink water.  Sometimes skips meals.  Recently diagnosed with OSA.     HISTORY:  She had had headaches as a child which eventually resolved.  She started having headaches again in late [facility] December 2021.  She describes a severe pulsating pain in the left parietal region with nausea, photophobia, phonophobia, blurred vision and sometimes vomiting.  No numbness or weakness.  They would last 10-12 hours and occur 3 to 4 times a week.  One time, she had an intractable headache lasting 2 1/2 to 3 days, requiring a Toradol   shot.  She was unable to drive home from work.  Triggers include stress, caffeine withdrawal and possibly vaping.  Sleep helped relieve it.  She thinks it work-related stress was the primary trigger.    She has neck pain.  Finished PT without much improvement.  MRI of C-spine without contrast on 11/22/2023 showed mild noncompressive disc bulging at  C3-4 and C4-5 without significant stenosis or impingement and mild left-sided facet hypertrophy at C7-T1 without stenosisOrthopedics said there wasn't anything they can offer.     Past NSAIDS/analgesics:  Excedrin, ibuprofen , Tylenol  Past abortive triptans:  sumatriptan , rizatriptan Past abortive ergotamine:  none Past muscle relaxants:  none Past anti-emetic:  Zofran , promethazine Past antihypertensive medications:  propranolol Past antidepressant medications:  Citalopram  30mg  QD Past anticonvulsant medications:  topiramate  50mg  at bedtime, zonisamide  200mg  daily Past anti-CGRP:  Nurtec PRN Past vitamins/Herbal/Supplements:  none Past antihistamines/decongestants:  Loratidine, hydroxyzine  Other past therapies:  PT/dry needling neck, cervical epidural  Past Medical History: 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   Cholecalciferol (VITAMIN D3) 25 MCG (1000 UT) CAPS 1 capsule.   clindamycin  (CLEOCIN  T) 1 % SWAB Apply 1 Application topically in the morning. After washing the effected areas (Face, Chest and Neck)   Cyanocobalamin (VITAMIN B12) 1000 MCG TBCR 1 tablet Orally Once a day   DULoxetine (CYMBALTA) 60 MG capsule Take 60 mg by mouth every morning.   fluconazole (DIFLUCAN) 150 MG tablet Take 150 mg by mouth every 3 (three) days.   Galcanezumab -gnlm (EMGALITY ) 120 MG/ML SOAJ Inject 120 mg into the skin every 28 (twenty-eight) days.   hydrOXYzine  (ATARAX ) 25 MG tablet Take 25 mg by mouth as needed.   ketoconazole  (NIZORAL ) 2 % shampoo Apply 1 Application topically 2 (two) times a week.   levonorgestrel (MIRENA) 20 MCG/DAY IUD 1 each by Intrauterine route once.    loratadine  (CLARITIN ) 10 MG tablet Take 10 mg by mouth daily.   meloxicam (MOBIC) 15 MG tablet 1 tablet Orally Once a day PRN   spironolactone  (ALDACTONE ) 100 MG tablet Take 1 tablet (100 mg total) by mouth daily.   tretinoin  (RETIN-A ) 0.025 % cream Apply topically at bedtime.   Ubrogepant  (UBRELVY ) 100 MG TABS Take 1 tablet (100 mg total) by mouth as needed. May repeat in 2 hours.  Maximum 2 tablets in 24 hours.   vitamin E 1000 UNIT capsule Take 1,000 Units by mouth daily.   No facility-administered encounter medications on file as of 08/30/2024.    Allergies: No Known Allergies  Family History: Family History  Problem Relation Age of Onset   Anxiety disorder Mother    Depression Mother    Anemia Mother    Alcohol abuse Father    Arthritis Maternal Grandmother    Cancer Maternal Grandfather        lung    Cancer Paternal Grandmother 66       lung cancer    Hearing loss Paternal Grandfather    Alcohol abuse Maternal Uncle    Drug abuse Maternal Uncle    Cancer Paternal Uncle    Drug abuse Maternal Aunt  Hearing loss Paternal Uncle    Intellectual disability Son    Diabetes Neg Hx    Heart disease Neg Hx     Observations/Objective:   No acute distress.  Alert and oriented.  Speech fluent and not dysarthric.  Language intact.  Eyes orthophoric on primary gaze.  Face symmetric.   Follow Up Instructions:    -I discussed the assessment and treatment plan with the patient. The patient was provided an opportunity to ask questions and all were answered. The patient agreed with the plan and demonstrated an understanding of the instructions.   The patient was advised to call back or seek an in-person evaluation if the symptoms worsen or if the condition fails to improve as anticipated.   Juliene Lamar Dunnings, DO

## 2024-08-30 ENCOUNTER — Telehealth (INDEPENDENT_AMBULATORY_CARE_PROVIDER_SITE_OTHER): Admitting: Neurology

## 2024-08-30 ENCOUNTER — Encounter: Payer: Self-pay | Admitting: Neurology

## 2024-08-30 VITALS — Ht 64.0 in | Wt 220.0 lb

## 2024-08-30 DIAGNOSIS — G43009 Migraine without aura, not intractable, without status migrainosus: Secondary | ICD-10-CM | POA: Diagnosis not present

## 2024-09-06 ENCOUNTER — Ambulatory Visit: Admitting: Family Medicine

## 2024-09-16 ENCOUNTER — Other Ambulatory Visit (HOSPITAL_COMMUNITY): Payer: Self-pay

## 2024-09-21 ENCOUNTER — Other Ambulatory Visit (HOSPITAL_COMMUNITY): Payer: Self-pay

## 2024-09-22 ENCOUNTER — Encounter: Payer: Self-pay | Admitting: Dermatology

## 2024-09-22 NOTE — Telephone Encounter (Signed)
 Please advise pt to take a 1 week break from her tretinoin .   After 1 week she should resume her tretinoin  1-2 nights per week as tolerated.  For immediate relief she should apply OTC hydrocortisone ointment (not the cream) twice a day for 5 days. She should be applying Zinc oxide based sunscreen every morning (I recommend the following).

## 2024-09-30 ENCOUNTER — Other Ambulatory Visit (HOSPITAL_COMMUNITY)

## 2024-10-06 ENCOUNTER — Encounter: Payer: Self-pay | Admitting: Dermatology

## 2024-10-06 NOTE — Telephone Encounter (Signed)
 Called pt, LVM in regard.   Per verbal conversation with Dr. Alm: - Pt is to D/C tretinoin  for 1 week.  - Use OTC hydrocortisone BID for 1 week to control redness and inflammation.  - After 1 week pt is to reintroduce tretinoin  1 night a week.

## 2024-10-07 NOTE — Telephone Encounter (Signed)
 Pt has returned call and has been informed of the instructions below.  I have informed her if she has another reaction after only applying Tretinoin  1x week to call the office and we can take next steps.

## 2024-10-08 ENCOUNTER — Other Ambulatory Visit (HOSPITAL_COMMUNITY): Payer: Self-pay

## 2024-10-09 ENCOUNTER — Other Ambulatory Visit (HOSPITAL_COMMUNITY): Payer: Self-pay

## 2024-10-12 ENCOUNTER — Telehealth: Admitting: Sleep Medicine

## 2024-10-12 DIAGNOSIS — G4733 Obstructive sleep apnea (adult) (pediatric): Secondary | ICD-10-CM | POA: Diagnosis not present

## 2024-10-12 DIAGNOSIS — E669 Obesity, unspecified: Secondary | ICD-10-CM

## 2024-10-12 NOTE — Progress Notes (Signed)
 Name:Ann Rice MRN: [id number] DOB: [birth year]   CHIEF COMPLAINT:  CPAP F/U   HISTORY OF PRESENT ILLNESS:  Ann Rice is a 33 y.o. w/ a h/o OSA, migraine disorder, depression, anxiety and obesity who presents virtually for CPAP follow up visit. Reports using CPAP therapy every night, which is confirmed by compliance data. She is currently using the Airfit F30i FFM, which is comfortable. States that she is still adjusting to CPAP therapy. Reports feeling significantly more refreshed upon awakening with CPAP therapy.    EPWORTH SLEEP SCORE 12    06/29/2024    2:00 PM  Results of the Epworth flowsheet  Sitting and reading 2  Watching TV 1  Sitting, inactive in a public place (e.g. a theatre or a meeting) 0  As a passenger in a car for an hour without a break 3  Lying down to rest in the afternoon when circumstances permit 3  Sitting and talking to someone 0  Sitting quietly after a lunch without alcohol 3  In a car, while stopped for a few minutes in traffic 0  Total score 12    PAST MEDICAL HISTORY :   has a past medical history of Anxiety, Arthritis (2024), 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HCC), Depression, Ear itch, GERD (gastroesophageal reflux disease), Headache, Loose stools, Migraine, and Sleep apnea.  has a past surgical history that includes Tonsillectomy; Foot surgery (Left, 2008); Bladder surgery; Wisdom tooth extraction; Cesarean section (N/A, 08/20/2019); Ureteral exploration; Colonoscopy with propofol  (N/A, 10/24/2022); and Fracture surgery (2008). Prior to Admission medications   Medication Sig Start Date End Date Taking? Authorizing Provider  Ascorbic Acid 500 MG CAPS 1 capsule.   Yes [provider]  Cholecalciferol (VITAMIN D3) 25 MCG (1000 UT) CAPS 1 capsule. 03/11/24  Yes [provider]  clindamycin  (CLEOCIN  T) 1 % SWAB Apply 1 Application topically in the morning. After washing the effected areas (Face, Chest and Neck) 04/21/24   Yes Alm Delon SAILOR, DO  Cyanocobalamin (VITAMIN B12) 1000 MCG TBCR 1 tablet Orally Once a day   Yes [provider]  DULoxetine (CYMBALTA) 60 MG capsule Take 60 mg by mouth every morning.   Yes [provider]  fluconazole (DIFLUCAN) 150 MG tablet Take 150 mg by mouth every 3 (three) days.   Yes [provider]  Galcanezumab -gnlm (EMGALITY ) 120 MG/ML SOAJ Inject 120 mg into the skin every 28 (twenty-eight) days. 04/20/24  Yes Jaffe, Adam R, DO  hydrOXYzine  (ATARAX ) 25 MG tablet Take 25 mg by mouth as needed.   Yes [provider]  ketoconazole  (NIZORAL ) 2 % shampoo Apply 1 Application topically 2 (two) times a week.   Yes [provider]  levonorgestrel (MIRENA) 20 MCG/DAY IUD 1 each by Intrauterine route once.   Yes [provider]  loratadine  (CLARITIN ) 10 MG tablet Take 10 mg by mouth daily. 03/11/24  Yes [provider]  meloxicam (MOBIC) 15 MG tablet 1 tablet Orally Once a day PRN 03/11/24  Yes [provider]  spironolactone  (ALDACTONE ) 100 MG tablet Take 1 tablet (100 mg total) by mouth daily. 04/21/24  Yes Alm Delon SAILOR, DO  tretinoin  (RETIN-A ) 0.025 % cream Apply topically at bedtime. 04/21/24 04/21/25 Yes Alm Delon SAILOR, DO  Ubrogepant  (UBRELVY ) 100 MG TABS Take 1 tablet (100 mg total) by mouth as needed. May repeat in 2 hours.  Maximum 2 tablets in 24 hours. 04/20/24  Yes Jaffe, Adam R, DO  vitamin E 1000 UNIT capsule Take 1,000  Units by mouth daily.   Yes [provider]   No Known Allergies  FAMILY HISTORY:  family history includes Alcohol abuse in her father and maternal uncle; Anemia in her mother; Anxiety disorder in her mother; Arthritis in her maternal grandmother; Cancer in her maternal grandfather and paternal uncle; Cancer (age of onset: 59) in her paternal grandmother; Depression in her mother; Drug abuse in her maternal aunt and maternal uncle; Hearing loss in her paternal grandfather and paternal  uncle; Intellectual disability in her son. SOCIAL HISTORY:  reports that she has quit smoking. Her smoking use included cigars. She has been exposed to tobacco smoke. She uses smokeless tobacco. She reports current alcohol use. She reports that she does not use drugs.   Review of Systems:  Gen:  Denies  fever, sweats, chills weight loss  HEENT: Denies blurred vision, double vision, ear pain, eye pain, hearing loss, nose bleeds, sore throat Cardiac:  No dizziness, chest pain or heaviness, chest tightness,edema, No JVD Resp:   No cough, -sputum production, -shortness of breath,-wheezing, -hemoptysis,  Gi: Denies swallowing difficulty, stomach pain, nausea or vomiting, diarrhea, constipation, bowel incontinence Gu:  Denies bladder incontinence, burning urine Ext:   Denies Joint pain, stiffness or swelling Skin: Denies  skin rash, easy bruising or bleeding or hives Endoc:  Denies polyuria, polydipsia , polyphagia or weight change Psych:   Denies depression, insomnia or hallucinations  Other:  All other systems negative  VITAL SIGNS: Unavailable due to telehealth visit   Physical Examination:   General Appearance: No distress  PSYCHIATRIC: Mood, affect within normal limits.  Virtual Visit via Video Note   I connected with Ann Rice on 10/12/24 at 11:15 AM EDT by a video enabled telemedicine application and verified that I am speaking with the correct person using two identifiers.   Location: Patient: Home Provider: Office   I discussed the limitations of evaluation and management by telemedicine and the availability of in person appointments. The patient expressed understanding and agreed to proceed.     ASSESSMENT AND PLAN  OSA Patient is using and benefiting from CPAP therapy. Counseled patient on increasing CPAP compliance. Discussed the consequences of untreated sleep apnea. Advised not to drive drowsy for safety of patient and others. Will follow up in 3 months.     Obesity Counseled patient on diet and lifestyle modification.    Patient  satisfied with Plan of action and management. All questions answered  I spent a total of 24 minutes reviewing chart data, face-to-face evaluation with the patient, counseling and coordination of care as detailed above.    Trinty Marken, M.D.  Sleep Medicine [facility]

## 2024-10-13 ENCOUNTER — Other Ambulatory Visit (HOSPITAL_COMMUNITY): Payer: Self-pay

## 2024-10-14 NOTE — Progress Notes (Deleted)
  [facility] [address] [place] [address] Phone: [phone] Subjective:    I'm seeing this patient by the request  of:  Lorren Greig PARAS, NP  CC: back and neck pain   YEP:Dlagzrupcz  Ann Rice is a 33 y.o. female coming in with complaint of back and neck pain. OMT 08/17/2024. Patient states   Medications patient has been prescribed: None  Taking:         Reviewed prior external information including notes and imaging from previsou exam, outside providers and external EMR if available.   As well as notes that were available from care everywhere and other healthcare systems.  Past medical history, social, surgical and family history all reviewed in electronic medical record.  No pertanent information unless stated regarding to the chief complaint.   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 POSITIVE muscle aches  Objective  There were no vitals taken for this visit.   General: No apparent distress alert and oriented x3 mood and affect normal, dressed appropriately.  HEENT: Pupils equal, extraocular movements intact  Respiratory: Patient's speak in full sentences and does not appear short of breath  Cardiovascular: No lower extremity edema, non tender, no erythema  Gait MSK:  Back   Osteopathic findings  C2 flexed rotated and side bent right C6 flexed rotated and side bent left T3 extended rotated and side bent right inhaled  rib T9 extended rotated and side bent left L2 flexed rotated and side bent right Sacrum right on right    Assessment and Plan:  No problem-specific Assessment & Plan notes found for this encounter.    Nonallopathic problems  Decision today to treat with OMT was based on Physical Exam  After verbal consent patient was treated with HVLA, ME, FPR techniques in cervical, rib, thoracic, lumbar, and sacral  areas  Patient tolerated the procedure well with improvement in symptoms  Patient given exercises, stretches and lifestyle modifications  See medications in patient instructions if given  Patient will follow up in 4-8 weeks      The above documentation has been reviewed and is accurate and complete Janiyah Beery M Kyrah Schiro, DO        Note: This dictation was prepared with Dragon dictation along with smaller phrase technology. Any transcriptional errors that result from this process are unintentional.

## 2024-10-15 ENCOUNTER — Other Ambulatory Visit (HOSPITAL_COMMUNITY): Payer: Self-pay

## 2024-10-18 ENCOUNTER — Ambulatory Visit: Admitting: Neurology

## 2024-10-18 ENCOUNTER — Ambulatory Visit: Admitting: Family Medicine

## 2024-10-19 NOTE — Progress Notes (Deleted)
 NEUROLOGY FOLLOW UP OFFICE NOTE  [name] [id number]  Assessment/Plan:   New headache likely migraine triggered by delay in getting Emgality .  No red flags.   Tension type headache Migraine without aura, without status migrainosus, not intractable Cervicalgia   Migraine prevention:  Emgality  Migraine rescue:  Ubrelvy  100mg  Treatment of neck pain with Sports Medicine Lifestyle modification: Limit use of pain relievers to no more than 9 days out of the month to prevent risk of rebound or medication-overuse headache. Diet modification/hydration/caffeine cessation Routine exercise Sleep hygiene Consider vitamins/supplements:  magnesium citrate 400mg  daily, riboflavin 400mg  daily, CoQ10 100mg  three times daily Keep headache diary Follow up ***      Subjective:  Ann Rice is a 33 year old right-handed female with psoriasis who follows up for new headache.   UPDATE: ***  Current NSAIDS/analgesics: meloxicam (for arthritis in neck - rarely takes) Current triptans:  none Current ergotamine:  none Current anti-emetic:  none Current muscle relaxants:  tizanidine  4mg  at bedtime Current Antihypertensive medications:  none Current Antidepressant medications:  duloxetine 30mg  daily Current Anticonvulsant medications:  none Current anti-CGRP:  Emgality , Ubrelvy  100mg   Current Antihistamines/Decongestants:  Xyzal Other therapy:  OMM ***, heating pad to the neck Hormone/birth control:  Mirena Other medications:  Latuda    Drinks 1 iced coffee in morning and sweet tea throughout the day.  Does not drink water.  Sometimes skips meals.  CPAP?  ***     HISTORY:  She had had headaches as a child which eventually resolved.  She started having headaches again in late [facility] December 2021.  She describes a severe pulsating pain in the left parietal region with nausea, photophobia, phonophobia, blurred vision and sometimes vomiting.  No numbness or weakness.  They  would last 10-12 hours and occur 3 to 4 times a week.  One time, she had an intractable headache lasting 2 1/2 to 3 days, requiring a Toradol  shot.  She was unable to drive home from work.  Triggers include stress, caffeine withdrawal and possibly vaping.  Sleep helped relieve it.  She thinks it work-related stress was the primary trigger.    In September 2025, she woke up with new type of headache, likely triggered by delay of receiving her Emgality .  It was described as a 9/10 right temporal throbbing headache.  She had associated photophobia and phonophobia.  No nausea but no appetite.  No visual disturbance.  Rested once of the day.  Tried to stay hydrated.  Pain aggravated with changing positions or standing up.  One time when she stood up, she lost her balance but didn't fall.  She also has history of waking up with dull headaches.  Diagnosed with sleep apnea.  CPAP ***  She has neck pain.  Finished PT without much improvement.  MRI of C-spine without contrast on 11/22/2023 showed mild noncompressive disc bulging at C3-4 and C4-5 without significant stenosis or impingement and mild left-sided facet hypertrophy at C7-T1 without stenosisOrthopedics said there wasn't anything they can offer.     Past NSAIDS/analgesics:  Excedrin, ibuprofen , Tylenol  Past abortive triptans:  sumatriptan , rizatriptan Past abortive ergotamine:  none Past muscle relaxants:  none Past anti-emetic:  Zofran , promethazine Past antihypertensive medications:  propranolol Past antidepressant medications:  Citalopram  30mg  QD Past anticonvulsant medications:  topiramate  50mg  at bedtime, zonisamide  200mg  daily Past anti-CGRP:  Nurtec PRN Past vitamins/Herbal/Supplements:  none Past antihistamines/decongestants:  Loratidine, hydroxyzine  Other past therapies:  PT/dry needling neck, cervical epidural  PAST MEDICAL HISTORY: Past Medical  History:  Diagnosis Date   Anxiety    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     Cholecalciferol (VITAMIN D3) 25 MCG (1000 UT) CAPS 1 capsule.     clindamycin  (CLEOCIN  T) 1 % SWAB Apply 1 Application topically in the morning. After washing the effected areas (Face, Chest and Neck) 69 each 6   Cyanocobalamin (VITAMIN B12) 1000 MCG TBCR 1 tablet Orally Once a day     DULoxetine (CYMBALTA) 60 MG capsule Take 60 mg by mouth every morning.     Galcanezumab -gnlm (EMGALITY ) 120 MG/ML SOAJ Inject 120 mg into the skin every 28 (twenty-eight) days. 1 mL 11   hydrOXYzine  (ATARAX ) 25 MG tablet Take 25 mg by mouth as needed.     ketoconazole  (NIZORAL ) 2 % shampoo Apply 1 Application topically 2 (two) times a week.     lamoTRIgine (LAMICTAL) 100 MG tablet Take 100 mg by mouth at bedtime.     levonorgestrel (MIRENA) 20 MCG/DAY IUD 1 each by Intrauterine route once.     loratadine  (CLARITIN ) 10 MG tablet Take 10 mg by mouth daily.     semaglutide-weight management (WEGOVY) 0.5 MG/0.5ML SOAJ SQ injection Inject 0.5 mg into the skin once a week.     semaglutide-weight management (WEGOVY) 1 MG/0.5ML SOAJ SQ injection Inject 1 mg into the skin once a week.     semaglutide-weight management (WEGOVY) 1.7 MG/0.75ML SOAJ SQ injection Inject 1.7 mg into the skin once a week.     spironolactone  (ALDACTONE ) 100 MG tablet Take 1 tablet (100 mg total) by mouth daily. 30 tablet 6   tretinoin  (RETIN-A ) 0.025 % cream Apply topically at bedtime. 45 g 4   Ubrogepant  (UBRELVY ) 100 MG TABS Take 1 tablet (100 mg total) by mouth as needed. May repeat in 2 hours.  Maximum 2 tablets in 24 hours. 16 tablet 11   vitamin E 1000 UNIT capsule Take 1,000 Units by mouth daily. (Patient taking differently: Take 1,000 Units by  mouth daily. Prn)     WEGOVY 0.25 MG/0.5ML SOAJ SQ injection Inject 0.25 mg into the skin once a week.     No current facility-administered medications on file prior to visit.    ALLERGIES: No Known Allergies  FAMILY HISTORY: Family History  Problem Relation Age of Onset   Anxiety disorder Mother    Depression Mother    Anemia Mother    Alcohol abuse Father    Arthritis Maternal Grandmother    Cancer Maternal Grandfather        lung    Cancer Paternal Grandmother 57       lung cancer    Hearing loss Paternal Grandfather    Alcohol abuse Maternal Uncle    Drug abuse Maternal Uncle    Cancer Paternal Uncle    Drug abuse Maternal Aunt    Hearing loss Paternal Uncle    Intellectual disability Son    Diabetes Neg Hx    Heart disease Neg Hx       Objective:  *** General: No acute distress.  Patient appears ***-groomed.   Head:  Normocephalic/atraumatic Eyes:  Fundi examined but not visualized Neck: supple, no paraspinal tenderness, full range of motion Heart:  Regular rate and rhythm Neurological Exam: alert and oriented.  Speech fluent and not dysarthric, language intact.  CN II-XII intact. Bulk and tone normal, muscle strength 5/5 throughout.  Sensation to light touch intact.  Deep tendon reflexes 2+ throughout, toes downgoing.  Finger to nose testing intact.  Gait normal, Romberg negative.   Juliene Dunnings, DO  CC: ***

## 2024-10-20 ENCOUNTER — Encounter: Payer: Self-pay | Admitting: Neurology

## 2024-10-20 ENCOUNTER — Ambulatory Visit: Admitting: Neurology

## 2024-10-24 ENCOUNTER — Other Ambulatory Visit: Payer: Self-pay | Admitting: Medical Genetics

## 2024-10-24 DIAGNOSIS — Z006 Encounter for examination for normal comparison and control in clinical research program: Secondary | ICD-10-CM

## 2024-10-28 NOTE — Progress Notes (Signed)
 [facility] [address] [place] [address] Phone: [phone] Subjective:   Ann Rice, am serving as a scribe for Dr. Arthea Claudene.  I'm seeing this patient by the request  of:  Jaycee Greig PARAS, NP  CC: Back and neck pain follow-up  YEP:Ann Rice  Ann Rice is a 33 y.o. female coming in with complaint of back and neck pain. OMT on 08/17/2024. Patient states recently some L side upper back and neck pain.  Medications patient has been prescribed:   Taking:         Reviewed prior external information including notes and imaging from previsou exam, outside providers and external EMR if available.   As well as notes that were available from care everywhere and other healthcare systems.  Past medical history, social, surgical and family history all reviewed in electronic medical record.  No pertanent information unless stated regarding to the chief complaint.   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 POSITIVE muscle aches  Objective  Blood pressure 110/84, pulse 94, height 5' 4 (1.626 m), weight 217 lb (98.4 kg), SpO2 99%.   General: No apparent distress alert and oriented x3 mood and affect normal, dressed appropriately.  HEENT: Pupils equal, extraocular movements intact  Respiratory: Patient's speak in full sentences and does not appear short of breath  Cardiovascular: No lower extremity edema, non tender, no erythema  Gait MSK:   Back significant increase in tightness noted in the parascapular area on the right greater than left.  Tenderness to palpation in the scapular area.  Tightness over the sacroiliac joint as well and seems to be left greater than right.  Osteopathic findings  C3 flexed rotated and side bent right C6 flexed rotated and side bent left T3 extended rotated and side bent right inhaled rib T9 extended rotated and side bent left L1 flexed rotated and side bent right L3 flexed rotated and side bent left Sacrum right on right     Assessment and Plan:  Scapular dyskinesis Believe that upper back pain is secondary to some muscle imbalances but was doing some ice-skating with her child and pushing him on the ice that I do think potentially could have contributed as well.  Increase activity slowly.  Discussed icing regimen.  Follow-up again in 6 to 8 weeks.    Nonallopathic problems  Decision today to treat with OMT was based on Physical Exam  After verbal consent patient was treated with HVLA, ME, FPR techniques in cervical, rib, thoracic, lumbar, and sacral  areas  Patient tolerated the procedure well with improvement in symptoms  Patient given exercises, stretches and lifestyle modifications  See medications in patient instructions if given  Patient will follow up in 4-8 weeks    The above documentation has been reviewed and is accurate and complete Ann Rice M Ann Carreras, DO          Note: This dictation was prepared  with Dragon dictation along with smaller phrase technology. Any transcriptional errors that result from this process are unintentional.

## 2024-11-01 ENCOUNTER — Encounter: Payer: Self-pay | Admitting: Family Medicine

## 2024-11-01 ENCOUNTER — Ambulatory Visit: Admitting: Family Medicine

## 2024-11-01 VITALS — BP 110/84 | HR 94 | Ht 64.0 in | Wt 217.0 lb

## 2024-11-01 DIAGNOSIS — G2589 Other specified extrapyramidal and movement disorders: Secondary | ICD-10-CM

## 2024-11-01 DIAGNOSIS — M9903 Segmental and somatic dysfunction of lumbar region: Secondary | ICD-10-CM

## 2024-11-01 DIAGNOSIS — M9908 Segmental and somatic dysfunction of rib cage: Secondary | ICD-10-CM

## 2024-11-01 DIAGNOSIS — M5030 Other cervical disc degeneration, unspecified cervical region: Secondary | ICD-10-CM

## 2024-11-01 DIAGNOSIS — M9901 Segmental and somatic dysfunction of cervical region: Secondary | ICD-10-CM | POA: Diagnosis not present

## 2024-11-01 DIAGNOSIS — M9904 Segmental and somatic dysfunction of sacral region: Secondary | ICD-10-CM

## 2024-11-01 DIAGNOSIS — M9902 Segmental and somatic dysfunction of thoracic region: Secondary | ICD-10-CM

## 2024-11-01 NOTE — Assessment & Plan Note (Signed)
 Known arthritic changes, likely no radicular symptoms.  Discussed the possibility of another epidural before the end of the year, put in an order if necessary but otherwise we will continue to monitor.  Follow-up again in 6 to 8 weeks

## 2024-11-01 NOTE — Patient Instructions (Addendum)
 Good to see you! Careful playing hockey [facility] [phone]  See you again in 2-3 months

## 2024-11-01 NOTE — Assessment & Plan Note (Signed)
 Believe that upper back pain is secondary to some muscle imbalances but was doing some ice-skating with her child and pushing him on the ice that I do think potentially could have contributed as well.  Increase activity slowly.  Discussed icing regimen.  Follow-up again in 6 to 8 weeks.

## 2024-11-10 ENCOUNTER — Encounter: Payer: Self-pay | Admitting: Family Medicine

## 2024-11-12 ENCOUNTER — Other Ambulatory Visit (HOSPITAL_COMMUNITY): Payer: Self-pay

## 2024-11-18 ENCOUNTER — Other Ambulatory Visit

## 2024-11-18 ENCOUNTER — Ambulatory Visit: Admitting: Family Medicine

## 2024-11-22 NOTE — Discharge Instructions (Signed)

## 2024-11-23 ENCOUNTER — Inpatient Hospital Stay
Admission: RE | Admit: 2024-11-23 | Discharge: 2024-11-23 | Disposition: A | Source: Ambulatory Visit | Attending: Family Medicine | Admitting: Family Medicine

## 2024-11-23 DIAGNOSIS — M5030 Other cervical disc degeneration, unspecified cervical region: Secondary | ICD-10-CM

## 2024-11-23 LAB — GENECONNECT MOLECULAR SCREEN: Genetic Analysis Overall Interpretation: NEGATIVE

## 2024-11-23 MED ORDER — TRIAMCINOLONE ACETONIDE 40 MG/ML IJ SUSP (RADIOLOGY)
60.0000 mg | Freq: Once | INTRAMUSCULAR | Status: AC
  Administered 2024-11-23: 60 mg via EPIDURAL

## 2024-11-23 MED ORDER — IOPAMIDOL (ISOVUE-M 300) INJECTION 61%
1.0000 mL | Freq: Once | INTRAMUSCULAR | Status: AC
  Administered 2024-11-23: 1 mL via EPIDURAL

## 2024-11-24 ENCOUNTER — Ambulatory Visit: Admitting: Dermatology

## 2024-11-24 ENCOUNTER — Encounter: Payer: Self-pay | Admitting: Dermatology

## 2024-11-24 VITALS — BP 108/76 | HR 105

## 2024-11-24 DIAGNOSIS — L700 Acne vulgaris: Secondary | ICD-10-CM | POA: Diagnosis not present

## 2024-11-24 DIAGNOSIS — L719 Rosacea, unspecified: Secondary | ICD-10-CM | POA: Diagnosis not present

## 2024-11-24 MED ORDER — SPIRONOLACTONE 100 MG PO TABS: 100.0000 mg | ORAL_TABLET | Freq: Every day | ORAL | 6 refills | Status: AC

## 2024-11-24 NOTE — Patient Instructions (Addendum)
 VISIT SUMMARY:  Today, we discussed your concerns about facial redness and acne management. We reviewed your history with clindamycin  and tretinoin , and your current use of spironolactone . We also talked about your skin's reaction to certain triggers and your current skincare routine.  YOUR PLAN:  -ROSACEA:  Rosacea is a skin condition that causes redness and visible blood vessels on your face. It can be triggered by spicy foods, stress, sun exposure, and alcohol. You should continue taking spironolactone  100 mg daily, use lorospress A anti-redness lotion with your other moisturizers, and avoid known triggers like spicy foods if they make your symptoms worse.  -ACNE VULGARIS:  Acne vulgaris is a common skin condition that causes pimples. Your acne is currently mild and managed with spironolactone .   You should continue taking spironolactone  100 mg daily and start using Avene retinol three nights a week (Monday, Wednesday, Friday) to help keep your skin smooth and prevent clogged pores.   We provided samples of Avene retinol and a coupon for purchase if it works well for you. We plan to restart tretinoin  in April or May, beginning with one night a week for two weeks, then increasing to two nights a week.  INSTRUCTIONS:  Please follow up as needed if you experience any new or worsening symptoms. We will plan to reassess your skin condition and treatment plan in a few months.    Important Information  Due to recent changes in healthcare laws, you may see results of your pathology and/or laboratory studies on MyChart before the doctors have had a chance to review them. We understand that in some cases there may be results that are confusing or concerning to you. Please understand that not all results are received at the same time and often the doctors may need to interpret multiple results in order to provide you with the best plan of care or course of treatment. Therefore, we ask that you please  give us  2 business days to thoroughly review all your results before contacting the office for clarification. Should we see a critical lab result, you will be contacted sooner.   If You Need Anything After Your Visit  If you have any questions or concerns for your doctor, please call our main line at [phone] If no one answers, please leave a voicemail as directed and we will return your call as soon as possible. Messages left after 4 pm will be answered the following business day.   You may also send us  a message via MyChart. We typically respond to MyChart messages within 1-2 business days.  For prescription refills, please ask your pharmacy to contact our office. Our fax number is [phone].  If you have an urgent issue when the clinic is closed that cannot wait until the next business day, you can page your doctor at the number below.    Please note that while we do our best to be available for urgent issues outside of office hours, we are not available 24/7.   If you have an urgent issue and are unable to reach us , you may choose to seek medical care at your doctor's office, retail clinic, urgent care center, or emergency room.  If you have a medical emergency, please immediately call 911 or go to the emergency department. In the event of inclement weather, please call our main line at [phone] for an update on the status of any delays or closures.  Dermatology Medication Tips: Please keep the boxes that topical medications come in  in order to help keep track of the instructions about where and how to use these. Pharmacies typically print the medication instructions only on the boxes and not directly on the medication tubes.   If your medication is too expensive, please contact our office at [phone] or send us  a message through MyChart.   We are unable to tell what your co-pay for medications will be in advance as this is different depending on your insurance coverage.  However, we may be able to find a substitute medication at lower cost or fill out paperwork to get insurance to cover a needed medication.   If a prior authorization is required to get your medication covered by your insurance company, please allow us  1-2 business days to complete this process.  Drug prices often vary depending on where the prescription is filled and some pharmacies may offer cheaper prices.  The website www.goodrx.com contains coupons for medications through different pharmacies. The prices here do not account for what the cost may be with help from insurance (it may be cheaper with your insurance), but the website can give you the price if you did not use any insurance.  - You can print the associated coupon and take it with your prescription to the pharmacy.  - You may also stop by our office during regular business hours and pick up a GoodRx coupon card.  - If you need your prescription sent electronically to a different pharmacy, notify our office through [facility] or by phone at [phone]

## 2024-11-24 NOTE — Progress Notes (Unsigned)
° °  Follow-Up Visit   Patient (and/or pt guardian) consented to the use of AI-assisted tools for note generation.  Subjective  Ann Rice is a 33 y.o. female who presents for the following: Acne  Patient was last evaluated on 07/07/2024 (tbse) & 06/28/24 (acne) At this visit patient was advised to continue with Spironolactone  100mg  daily, Tretinoin  0.025% 2-3 nights a week, Clindamycin  swabs in AM and recommended to use sunscreen  Patient reports she was having issues with redness and itching on face after using clindamycin  gel and swabs. Sent messages through MyChart in October about these sxs. Patient reports she does not have those symptoms when not using Clindamycin . Patient was advise to stop use of those and to apply OTC hydrocortisone on area for relief and to only use Tretinoin  once a week. Patient reports she has not used tretinoin  since mid October Patient states that she takes Spironolactone  daily Patient reports she uses LaRoche Posay wash and moisturizer with sunscreen daily Patient reports seeing one or two new pimples come up every two weeks  Patient reports she does not apply any spot treatment to those areas  Patient is concerned she may have another condition on her face due to current symptoms and sensitivity to acne regimen  Patient reports sxs are unchanged.  Patient denies medication changes.    The following portions of the chart were reviewed this encounter and updated as appropriate: medications, allergies, medical history  Review of Systems:  No other skin or systemic complaints except as noted in HPI or Assessment and Plan.  Objective  Well appearing patient in no apparent distress; mood and affect are within normal limits.  A focused examination was performed of the following areas: Face  Relevant exam findings are noted in the Assessment and Plan.            Assessment & Plan  ACNE VULGARIS Exam: Open comedones and inflammatory papules  on face  Not at goal  Treatment Plan: Continue with Spironolactone  100mg  daily Continue with LaRoche Posay wash and moisturizer Advised to discontinue use of Tretinoin  and recommeded use of Avene retinol moisturizer 3 nights a week    ROSACEA Exam Mid face erythema with telangiectasias +/- scattered inflammatory papules on face  Flared  Rosacea is a chronic progressive skin condition usually affecting the face of adults, causing redness and/or acne bumps. It is treatable but not curable. It sometimes affects the eyes (ocular rosacea) as well. It may respond to topical and/or systemic medication and can flare with stress, sun exposure, alcohol, exercise, topical steroids (including hydrocortisone/cortisone 10) and some foods.  Daily application of broad spectrum spf 30+ sunscreen to face is recommended to reduce flares.  Patient denies grittiness of the eyes  Treatment Plan Recommended using LaRoche Posay anti-redness moisturizer            Return if symptoms worsen or fail to improve, for Acne F/U.  LILLETTE [name], am acting as a neurosurgeon for [employer], DO .   Documentation: I have reviewed the above documentation for accuracy and completeness, and I agree with the above.  Delon Lenis, DO

## 2024-11-29 ENCOUNTER — Ambulatory Visit: Admitting: Neurology

## 2024-12-30 NOTE — Progress Notes (Signed)
 " [facility] [address] [place] [address] Phone: [phone] Subjective:   ISusannah Rice, am serving as a scribe for Dr. Arthea Claudene.  I'm seeing this patient by the request  of:  Ann Greig PARAS, NP  CC: back and neck pain follow up   YEP:Dlagzrupcz  Ann Rice is a 34 y.o. female coming in with complaint of back and neck pain. OMT 11/01/2024. Patient states had epidural 11/19/2024 and can't tell much of a difference. No new symptoms.  Medications patient has been prescribed: None  Taking:      Reviewed prior external information including notes and imaging from previsou exam, outside providers and external EMR if available.   As well as notes that were available from care everywhere and other healthcare systems.  Past medical history, social, surgical and family history all reviewed in electronic medical record.  No pertanent information unless stated regarding to the chief complaint.   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 POSITIVE muscle aches, headaches  Objective  Blood pressure 116/84, pulse (!) 105, height 5' 4 (1.626 m), weight 222 lb (100.7 kg), SpO2 96%.   General: No apparent distress alert and oriented x3 mood and affect normal, dressed appropriately.  HEENT: Pupils equal, extraocular movements intact  Respiratory: Patient's speak in full sentences and does not appear short of breath  Cardiovascular: No lower extremity edema, non  tender, no erythema  Gait relatively normal MSK:  Back does have some mild loss of lordosis noted.  Some tenderness to palpation in the parascapular area.  Neck exam still has some limited range of motion noted.  Osteopathic findings  C2 flexed rotated and side bent right C3 flexed rotated and side bent left C6 flexed rotated and side bent left T3 extended rotated and side bent right inhaled rib T9 extended rotated and side bent left L2 flexed rotated and side bent right L3 flexed rotated and side bent left Sacrum right on right     Assessment and Plan:  Degenerative cervical disc Degenerative disc disease.  Discussed which activities to do and which ones to avoid.  Increase activity slowly.  Discussed icing regimen.  Will follow-up again in 6 to 8 weeks.  Did discuss the possibility of another epidural.  Discussed location lasting Celebrex  and given prescription.  Warned of potential side effects.  Follow-up again 6 to 8 weeks    Nonallopathic problems  Decision today to treat with OMT was based on Physical Exam  After verbal consent patient was treated with HVLA, ME, FPR techniques in cervical, rib, thoracic, lumbar, and sacral  areas  Patient tolerated the procedure well with improvement in symptoms  Patient given exercises, stretches and lifestyle modifications  See medications in patient instructions if given  Patient will follow up in 4-8 weeks    The above documentation has been reviewed and is accurate and complete Ann Rice M Ann Fiorillo, DO  Note: This dictation was prepared with Dragon dictation along with smaller phrase technology. Any transcriptional errors that result from this process are unintentional.            [1] No Known Allergies  "

## 2024-12-31 ENCOUNTER — Other Ambulatory Visit: Payer: Self-pay | Admitting: Physical Medicine and Rehabilitation

## 2024-12-31 DIAGNOSIS — R1310 Dysphagia, unspecified: Secondary | ICD-10-CM

## 2025-01-04 ENCOUNTER — Encounter: Payer: Self-pay | Admitting: Family Medicine

## 2025-01-04 ENCOUNTER — Ambulatory Visit: Admitting: Family Medicine

## 2025-01-04 VITALS — BP 116/84 | HR 105 | Ht 64.0 in | Wt 222.0 lb

## 2025-01-04 DIAGNOSIS — M5030 Other cervical disc degeneration, unspecified cervical region: Secondary | ICD-10-CM

## 2025-01-04 DIAGNOSIS — M9902 Segmental and somatic dysfunction of thoracic region: Secondary | ICD-10-CM | POA: Diagnosis not present

## 2025-01-04 DIAGNOSIS — M9904 Segmental and somatic dysfunction of sacral region: Secondary | ICD-10-CM | POA: Diagnosis not present

## 2025-01-04 DIAGNOSIS — M9908 Segmental and somatic dysfunction of rib cage: Secondary | ICD-10-CM | POA: Diagnosis not present

## 2025-01-04 DIAGNOSIS — M9901 Segmental and somatic dysfunction of cervical region: Secondary | ICD-10-CM | POA: Diagnosis not present

## 2025-01-04 DIAGNOSIS — M9903 Segmental and somatic dysfunction of lumbar region: Secondary | ICD-10-CM | POA: Diagnosis not present

## 2025-01-04 MED ORDER — CELECOXIB 200 MG PO CAPS: 200.0000 mg | ORAL_CAPSULE | Freq: Two times a day (BID) | ORAL | 0 refills | Status: AC | PRN

## 2025-01-04 NOTE — Patient Instructions (Addendum)
 Celebrex  200mg  Keep doing exercises We can consider another epidural  See you again in 6-8 weeks

## 2025-01-04 NOTE — Assessment & Plan Note (Signed)
 Degenerative disc disease.  Discussed which activities to do and which ones to avoid.  Increase activity slowly.  Discussed icing regimen.  Will follow-up again in 6 to 8 weeks.  Did discuss the possibility of another epidural.  Discussed location lasting Celebrex  and given prescription.  Warned of potential side effects.  Follow-up again 6 to 8 weeks

## 2025-01-05 ENCOUNTER — Ambulatory Visit: Admitting: Neurology

## 2025-01-13 ENCOUNTER — Encounter: Payer: Self-pay | Admitting: Family Medicine

## 2025-01-13 ENCOUNTER — Encounter: Payer: Self-pay | Admitting: Sleep Medicine

## 2025-01-13 DIAGNOSIS — G4733 Obstructive sleep apnea (adult) (pediatric): Secondary | ICD-10-CM

## 2025-01-13 DIAGNOSIS — E66812 Obesity, class 2: Secondary | ICD-10-CM

## 2025-01-13 DIAGNOSIS — Z6837 Body mass index (BMI) 37.0-37.9, adult: Secondary | ICD-10-CM

## 2025-01-13 DIAGNOSIS — R7302 Impaired glucose tolerance (oral): Secondary | ICD-10-CM

## 2025-01-13 DIAGNOSIS — R748 Abnormal levels of other serum enzymes: Secondary | ICD-10-CM

## 2025-01-13 NOTE — Progress Notes (Signed)
 " [facility] [address] [place] [address] Phone: [phone] Subjective:   [name] am a scribe for Dr. Claudene.  I'm seeing this patient by the request  of:  Ann Greig PARAS, NP  CC: Foot and ankle pain  YEP:Dlagzrupcz  [facility] is a 34 y.o. female coming in with complaint of right heel pain. Last seen on 01/04/2025 for OMT. Patient states that it started Monday. She was on her feet for a long time taking down Christmas decorations. Also was digging her heels into the snow while walking to prevent herself from falling forward.        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HCC)    Depression    Ear itch    GERD (gastroesophageal reflux disease)    Headache    Loose stools    Migraine    Sleep apnea    Past Surgical History:  Procedure Laterality Date   BLADDER SURGERY     CESAREAN SECTION N/A 08/20/2019   Procedure: CESAREAN SECTION;  Surgeon: Sarrah Browning, MD;  Location: [facility] LD ORS;  Service: Obstetrics;  Laterality: N/A;   COLONOSCOPY WITH PROPOFOL  N/A 10/24/2022   Procedure: COLONOSCOPY WITH PROPOFOL ;  Surgeon: Unk Corinn Skiff, MD;  Location: [facility] ENDOSCOPY;  Service: Gastroenterology;  Laterality: N/A;   FOOT SURGERY Left 2008   great toe broken, pin placed and removed    FRACTURE SURGERY  2008   Left Great Toe   TONSILLECTOMY     At age of 67   URETERAL EXPLORATION     WISDOM TOOTH EXTRACTION     Social History   Socioeconomic History   Marital status: Single    Spouse name: Not on file   Number of children: 0    Years of education: some colle   Highest education level: Not on file  Occupational History   Occupation: Unemployed     Employer: [employer]  Tobacco Use   Smoking status: Former    Types: Cigars    Passive exposure: Current   Smokeless tobacco: Current  Vaping Use   Vaping status: Every Day    Substances: Nicotine  Substance and Sexual Activity   Alcohol use: Yes    Comment: social   Drug use: No   Sexual activity: Yes    Birth control/protection: Injection  Other Topics Concern   Not on file  Social History Narrative   Lives with husband.   Has 2 dogs.    Right handed   2 story home   Caffeine yes      Social Drivers of Health   Tobacco Use: High Risk (01/04/2025)   Patient History    Smoking Tobacco Use: Former    Smokeless Tobacco Use: Current    Passive Exposure: Current  Physicist, Medical Strain: Low Risk  (01/04/2025)   [facility]   Overall Financial Resource Strain (CARDIA)    Difficulty of Paying Living Expenses: Not hard at all  Food Insecurity: No Food Insecurity (01/04/2025)   Received from [facility]   Epic    Within the past 12 months, you worried that your food would run out before you got the money to buy more.: Never true    Within the past 12 months, the food you bought just didn't last and you didn't have money to  get more.: Never true  Transportation Needs: No Transportation Needs (01/04/2025)   Received from [facility] - [facility]    In the past 12 months, has lack of transportation kept you from medical appointments or from getting medications?: No    Lack of Transportation (Non-Medical): No  Physical Activity: Not on file  Stress: Not on file  Social Connections: Not on file  Depression (EYV7-0): Not on file  Alcohol Screen: Not on file  Housing: Unknown (01/04/2025)   Received [facility]   Epic    In the last 12 months, was there a time when you were not able to pay the mortgage or rent on time?: No    Number of Times Moved in the Last Year: Not on file    At any time in the past 12 months, were you homeless or living in a shelter (including now)?: No  Utilities: Not At Risk (01/04/2025)   [facility]   Epic    In the past 12 months has the electric, gas, oil, or water company threatened to shut off services in your home?: No  Health Literacy: Not on file   Allergies[1] Family History  Problem Relation Age of Onset   Anxiety disorder Mother    Depression Mother    Anemia Mother    Alcohol abuse Father    Arthritis Maternal Grandmother    Cancer Maternal Grandfather        lung    Cancer Paternal Grandmother 49       lung cancer    Hearing loss Paternal Grandfather    Alcohol abuse Maternal Uncle    Drug abuse Maternal Uncle    Cancer Paternal Uncle    Drug abuse Maternal Aunt    Hearing loss Paternal Uncle    Intellectual disability Son    Diabetes Neg Hx    Heart disease Neg Hx     Current Outpatient Medications (Endocrine & Metabolic):    levonorgestrel (MIRENA) 20 MCG/DAY IUD, 1 each by Intrauterine route once.  Current Outpatient Medications (Cardiovascular):    spironolactone  (ALDACTONE ) 100 MG tablet, Take 1 tablet (100 mg total) by mouth daily.  Current Outpatient Medications (Respiratory):    loratadine  (CLARITIN ) 10 MG tablet, Take 10 mg by mouth daily.  Current Outpatient Medications (Analgesics):    celecoxib  (CELEBREX ) 200 MG capsule, Take 1 capsule (200 mg total) by mouth 2 (two) times daily as needed.   Galcanezumab -gnlm (EMGALITY ) 120 MG/ML SOAJ, Inject 120 mg into the skin every 28 (twenty-eight) days.   Ubrogepant  (UBRELVY ) 100 MG TABS, Take 1 tablet (100 mg total) by mouth as needed. May repeat in 2 hours.  Maximum 2 tablets in 24 hours.  Current Outpatient Medications (Hematological):    Cyanocobalamin (VITAMIN B12) 1000 MCG TBCR, 1 tablet Orally Once a day  Current Outpatient Medications (Other):    Cholecalciferol (VITAMIN D3) 25 MCG (1000 UT) CAPS, 1 capsule.   DULoxetine (CYMBALTA) 60 MG capsule, Take 60 mg by mouth every morning.   ketoconazole  (NIZORAL ) 2 % shampoo, Apply 1 Application topically 2 (two) times a week.   Ascorbic Acid  500 MG CAPS, 1 capsule.   clindamycin  (CLEOCIN  T) 1 % SWAB, Apply 1 Application topically in the morning. After washing the effected areas (Face, Chest and Neck)   hydrOXYzine  (ATARAX ) 25 MG tablet, Take 25 mg by mouth as needed.   lamoTRIgine (LAMICTAL) 100 MG tablet, Take 100 mg  by mouth at bedtime.   semaglutide-weight management (WEGOVY) 0.5 MG/0.5ML SOAJ SQ injection, Inject 0.5 mg into the skin once a week.   semaglutide-weight management (WEGOVY) 1 MG/0.5ML SOAJ SQ injection, Inject 1 mg into the skin once a week.   semaglutide-weight management (WEGOVY) 1.7 MG/0.75ML SOAJ SQ injection, Inject 1.7 mg into the skin once a week.   tretinoin  (RETIN-A ) 0.025 % cream, Apply topically at bedtime.   vitamin E 1000 UNIT capsule, Take 1,000 Units by mouth daily. (Patient taking differently: Take 1,000 Units by mouth daily. Prn)   WEGOVY 0.25 MG/0.5ML SOAJ SQ injection, Inject 0.25 mg into the skin once a week.   Reviewed prior external information including notes and imaging from  primary care provider As well as notes that were available from care everywhere and other healthcare systems.  Past medical history, social, surgical and family history all reviewed in electronic medical record.  No pertanent information unless stated regarding to the chief complaint.   Review of Systems:  No headache, visual changes, nausea, vomiting, diarrhea, constipation, dizziness, abdominal pain, skin rash, fevers, chills, night sweats, weight loss, swollen lymph nodes, body aches, joint swelling, chest pain, shortness of breath, mood changes. POSITIVE muscle aches  Objective  Blood pressure 102/60, pulse 100, height 5' 4 (1.626 m), weight 225 lb (102.1 kg), SpO2 99%.   General: No apparent distress alert and oriented x3 mood and affect normal, dressed appropriately.  HEENT: Pupils equal, extraocular movements intact  Respiratory: Patient's speak in full sentences and does not appear short of breath   Cardiovascular: No lower extremity edema, non tender, no erythema  Foot exam shows patient has some mild thickness of the skin at the dorsal lateral aspect of the calcaneal area.  Contralateral side. Mild antalgic gait   Limited muscular skeletal ultrasound was performed and interpreted by CLAUDENE HUSSAR, M  Limited ultrasound does have hypoechoic changes in the fat pad noted.  Likely contributing no masses noted.  No other significant findings except for mild hypoechoic changes of the peroneal tendon.    Impression and Recommendations:     The above documentation has been reviewed and is accurate and complete Arretta Toenjes M Arbell Wycoff, DO       [1] No Known Allergies  "

## 2025-01-14 ENCOUNTER — Other Ambulatory Visit: Payer: Self-pay

## 2025-01-14 ENCOUNTER — Ambulatory Visit: Admitting: Family Medicine

## 2025-01-14 ENCOUNTER — Encounter: Payer: Self-pay | Admitting: Family Medicine

## 2025-01-14 VITALS — BP 102/60 | HR 100 | Ht 64.0 in | Wt 225.0 lb

## 2025-01-14 DIAGNOSIS — E65 Localized adiposity: Secondary | ICD-10-CM | POA: Insufficient documentation

## 2025-01-14 DIAGNOSIS — M79671 Pain in right foot: Secondary | ICD-10-CM

## 2025-01-14 NOTE — Patient Instructions (Addendum)
 Good to see you. Heel lifts in shoes for 2 weeks. Exercises. Ice no longer than 10 minutes. Make someone else shovel the snow. Keep your other appointment for follow up.

## 2025-01-18 ENCOUNTER — Other Ambulatory Visit (HOSPITAL_COMMUNITY): Payer: Self-pay

## 2025-01-18 NOTE — Assessment & Plan Note (Signed)
 Patient is more of a fat pad syndrome and noted but also with a mild peroneal tendinitis.  Heel lifts, discussed avoiding being barefoot and recovery sandals.  Discussed potential need for prednisone.  Increase activity slowly otherwise.  Follow-up again in 6 to 12 weeks.

## 2025-01-19 ENCOUNTER — Encounter (INDEPENDENT_AMBULATORY_CARE_PROVIDER_SITE_OTHER): Payer: Self-pay

## 2025-02-02 ENCOUNTER — Ambulatory Visit: Admitting: Neurology

## 2025-02-10 ENCOUNTER — Other Ambulatory Visit

## 2025-02-16 ENCOUNTER — Ambulatory Visit: Admitting: Family Medicine

## 2025-07-11 ENCOUNTER — Ambulatory Visit: Admitting: Dermatology

## 2025-07-11 ENCOUNTER — Ambulatory Visit: Admitting: Neurology

## 2025-08-15 ENCOUNTER — Ambulatory Visit: Admitting: Dermatology
